# Patient Record
Sex: Female | Born: 1935 | Race: White | Hispanic: No | Smoking: Former smoker
Health system: Southern US, Community
[De-identification: ages and names within clinical notes are randomized; demographics above are authoritative.]

## PROBLEM LIST (undated history)

## (undated) DIAGNOSIS — R413 Other amnesia: Secondary | ICD-10-CM

## (undated) DIAGNOSIS — E875 Hyperkalemia: Secondary | ICD-10-CM

## (undated) DIAGNOSIS — I10 Essential (primary) hypertension: Secondary | ICD-10-CM

## (undated) DIAGNOSIS — I340 Nonrheumatic mitral (valve) insufficiency: Secondary | ICD-10-CM

## (undated) DIAGNOSIS — K5790 Diverticulosis of intestine, part unspecified, without perforation or abscess without bleeding: Secondary | ICD-10-CM

## (undated) DIAGNOSIS — R600 Localized edema: Secondary | ICD-10-CM

## (undated) DIAGNOSIS — H9190 Unspecified hearing loss, unspecified ear: Secondary | ICD-10-CM

## (undated) DIAGNOSIS — F0390 Unspecified dementia without behavioral disturbance: Secondary | ICD-10-CM

## (undated) DIAGNOSIS — E785 Hyperlipidemia, unspecified: Secondary | ICD-10-CM

## (undated) DIAGNOSIS — B351 Tinea unguium: Secondary | ICD-10-CM

## (undated) HISTORY — DX: Unspecified dementia without behavioral disturbance: F03.90

## (undated) HISTORY — DX: Hyperlipidemia, unspecified: E78.5

## (undated) HISTORY — DX: Diverticulosis of intestine, part unspecified, without perforation or abscess without bleeding: K57.90

## (undated) HISTORY — DX: Essential (primary) hypertension: I10

## (undated) HISTORY — DX: Nonrheumatic mitral (valve) insufficiency: I34.0

## (undated) HISTORY — DX: Localized edema: R60.0

## (undated) HISTORY — DX: Hyperkalemia: E87.5

## (undated) HISTORY — PX: OTHER SURGICAL HISTORY: SHX169

## (undated) HISTORY — DX: Tinea unguium: B35.1

---

## 1988-01-03 HISTORY — PX: OTHER SURGICAL HISTORY: SHX169

## 1998-09-08 ENCOUNTER — Other Ambulatory Visit: Admission: RE | Admit: 1998-09-08 | Discharge: 1998-09-08 | Payer: Self-pay | Admitting: Internal Medicine

## 1998-10-01 ENCOUNTER — Ambulatory Visit (HOSPITAL_COMMUNITY): Admission: RE | Admit: 1998-10-01 | Discharge: 1998-10-01 | Payer: Self-pay | Admitting: Vascular Surgery

## 1998-10-01 ENCOUNTER — Encounter: Payer: Self-pay | Admitting: Vascular Surgery

## 1998-11-16 ENCOUNTER — Inpatient Hospital Stay (HOSPITAL_COMMUNITY): Admission: AD | Admit: 1998-11-16 | Discharge: 1998-11-21 | Payer: Self-pay | Admitting: Internal Medicine

## 1998-11-16 ENCOUNTER — Encounter: Payer: Self-pay | Admitting: Internal Medicine

## 1998-11-17 ENCOUNTER — Encounter: Payer: Self-pay | Admitting: Internal Medicine

## 1999-12-18 ENCOUNTER — Encounter: Payer: Self-pay | Admitting: Emergency Medicine

## 1999-12-18 ENCOUNTER — Emergency Department (HOSPITAL_COMMUNITY): Admission: EM | Admit: 1999-12-18 | Discharge: 1999-12-18 | Payer: Self-pay | Admitting: Emergency Medicine

## 2000-08-10 ENCOUNTER — Encounter: Admission: RE | Admit: 2000-08-10 | Discharge: 2000-08-10 | Payer: Self-pay

## 2004-05-18 ENCOUNTER — Encounter: Admission: RE | Admit: 2004-05-18 | Discharge: 2004-05-18 | Payer: Self-pay | Admitting: Internal Medicine

## 2004-05-26 ENCOUNTER — Ambulatory Visit: Payer: Self-pay | Admitting: Gastroenterology

## 2004-07-12 ENCOUNTER — Ambulatory Visit: Payer: Self-pay | Admitting: Gastroenterology

## 2006-10-23 ENCOUNTER — Encounter: Admission: RE | Admit: 2006-10-23 | Discharge: 2006-10-23 | Payer: Self-pay | Admitting: Internal Medicine

## 2009-02-01 ENCOUNTER — Emergency Department (HOSPITAL_COMMUNITY): Admission: EM | Admit: 2009-02-01 | Discharge: 2009-02-01 | Payer: Self-pay | Admitting: Emergency Medicine

## 2009-02-03 ENCOUNTER — Observation Stay (HOSPITAL_COMMUNITY): Admission: EM | Admit: 2009-02-03 | Discharge: 2009-02-08 | Payer: Self-pay | Admitting: Emergency Medicine

## 2010-01-23 ENCOUNTER — Encounter: Payer: Self-pay | Admitting: Internal Medicine

## 2010-03-21 LAB — POCT I-STAT, CHEM 8
BUN: 7 mg/dL (ref 6–23)
Calcium, Ion: 1.03 mmol/L — ABNORMAL LOW (ref 1.12–1.32)
Chloride: 98 mEq/L (ref 96–112)
Creatinine, Ser: 0.8 mg/dL (ref 0.4–1.2)
Glucose, Bld: 98 mg/dL (ref 70–99)
HCT: 42 % (ref 36.0–46.0)
Sodium: 131 mEq/L — ABNORMAL LOW (ref 135–145)
TCO2: 28 mmol/L (ref 0–100)

## 2010-03-21 LAB — URINALYSIS, ROUTINE W REFLEX MICROSCOPIC
Nitrite: NEGATIVE
Protein, ur: NEGATIVE mg/dL
Urobilinogen, UA: 0.2 mg/dL (ref 0.0–1.0)

## 2010-03-21 LAB — DIFFERENTIAL
Basophils Absolute: 0.2 10*3/uL — ABNORMAL HIGH (ref 0.0–0.1)
Basophils Relative: 2 % — ABNORMAL HIGH (ref 0–1)
Monocytes Absolute: 0.7 10*3/uL (ref 0.1–1.0)

## 2010-03-21 LAB — CBC
HCT: 41.3 % (ref 36.0–46.0)
MCHC: 34.5 g/dL (ref 30.0–36.0)
MCV: 91.4 fL (ref 78.0–100.0)
RBC: 4.51 MIL/uL (ref 3.87–5.11)
RDW: 12.2 % (ref 11.5–15.5)
WBC: 7.8 10*3/uL (ref 4.0–10.5)

## 2010-03-24 LAB — BASIC METABOLIC PANEL
BUN: 6 mg/dL (ref 6–23)
BUN: 7 mg/dL (ref 6–23)
Calcium: 8.8 mg/dL (ref 8.4–10.5)
Creatinine, Ser: 0.63 mg/dL (ref 0.4–1.2)
GFR calc non Af Amer: >60 mL/min (ref >60)
GFR calc non Af Amer: >60 mL/min (ref >60)
Glucose, Bld: 100 mg/dL — ABNORMAL HIGH (ref 70–99)
Glucose, Bld: 96 mg/dL (ref 70–99)
Potassium: 4.1 mEq/L (ref 3.5–5.1)
Sodium: 134 mEq/L — ABNORMAL LOW (ref 135–145)

## 2010-03-24 LAB — COMPREHENSIVE METABOLIC PANEL
ALT: 18 U/L (ref 0–35)
AST: 26 U/L (ref 0–37)
Albumin: 3.8 g/dL (ref 3.5–5.2)
Alkaline Phosphatase: 68 U/L (ref 39–117)
BUN: 8 mg/dL (ref 6–23)
CO2: 29 mEq/L (ref 19–32)
Calcium: 8.3 mg/dL — ABNORMAL LOW (ref 8.4–10.5)
Calcium: 9.2 mg/dL (ref 8.4–10.5)
Creatinine, Ser: 0.59 mg/dL (ref 0.4–1.2)
Creatinine, Ser: 0.82 mg/dL (ref 0.4–1.2)
GFR calc Af Amer: >60 mL/min (ref >60)
GFR calc non Af Amer: >60 mL/min (ref >60)
Glucose, Bld: 123 mg/dL — ABNORMAL HIGH (ref 70–99)
Sodium: 125 mEq/L — ABNORMAL LOW (ref 135–145)
Total Bilirubin: 1.1 mg/dL (ref 0.3–1.2)
Total Protein: 6.7 g/dL (ref 6.0–8.3)

## 2010-03-24 LAB — DIFFERENTIAL
Basophils Absolute: 0 10*3/uL (ref 0.0–0.1)
Basophils Relative: 0 % (ref 0–1)
Basophils Relative: 0 % (ref 0–1)
Eosinophils Absolute: 0 10*3/uL (ref 0.0–0.7)
Eosinophils Absolute: 0 10*3/uL (ref 0.0–0.7)
Eosinophils Absolute: 0.2 10*3/uL (ref 0.0–0.7)
Eosinophils Relative: 0 % (ref 0–5)
Eosinophils Relative: 2 % (ref 0–5)
Lymphocytes Relative: 12 % (ref 12–46)
Lymphocytes Relative: 15 % (ref 12–46)
Lymphocytes Relative: 35 % (ref 12–46)
Lymphs Abs: 2.1 10*3/uL (ref 0.7–4.0)
Lymphs Abs: 2.2 10*3/uL (ref 0.7–4.0)
Monocytes Absolute: 0.9 10*3/uL (ref 0.1–1.0)
Monocytes Absolute: 1.1 10*3/uL — ABNORMAL HIGH (ref 0.1–1.0)
Neutro Abs: 11.9 10*3/uL — ABNORMAL HIGH (ref 1.7–7.7)
Neutro Abs: 14.9 10*3/uL — ABNORMAL HIGH (ref 1.7–7.7)
Neutrophils Relative %: 78 % — ABNORMAL HIGH (ref 43–77)

## 2010-03-24 LAB — URINALYSIS, ROUTINE W REFLEX MICROSCOPIC
Bilirubin Urine: NEGATIVE
Nitrite: NEGATIVE
Protein, ur: NEGATIVE mg/dL
Urobilinogen, UA: 0.2 mg/dL (ref 0.0–1.0)

## 2010-03-24 LAB — HEMOGLOBIN A1C
Hgb A1c MFr Bld: 6.1 % (ref 4.6–6.1)
Mean Plasma Glucose: 128 mg/dL

## 2010-03-24 LAB — CBC
HCT: 30.9 % — ABNORMAL LOW (ref 36.0–46.0)
HCT: 36.1 % (ref 36.0–46.0)
Hemoglobin: 11.9 g/dL — ABNORMAL LOW (ref 12.0–15.0)
Hemoglobin: 12.3 g/dL (ref 12.0–15.0)
MCHC: 35.1 g/dL (ref 30.0–36.0)
MCV: 91.5 fL (ref 78.0–100.0)
Platelets: 139 10*3/uL — ABNORMAL LOW (ref 150–400)
Platelets: 165 10*3/uL (ref 150–400)
RDW: 12.7 % (ref 11.5–15.5)
RDW: 12.8 % (ref 11.5–15.5)
RDW: 12.9 % (ref 11.5–15.5)
RDW: 12.9 % (ref 11.5–15.5)
WBC: 15.2 10*3/uL — ABNORMAL HIGH (ref 4.0–10.5)
WBC: 18 10*3/uL — ABNORMAL HIGH (ref 4.0–10.5)

## 2010-03-24 LAB — MAGNESIUM: Magnesium: 2.2 mg/dL (ref 1.5–2.5)

## 2010-03-24 LAB — URINE CULTURE: Colony Count: 25000

## 2010-03-24 LAB — TSH: TSH: 1.547 u[IU]/mL (ref 0.350–4.500)

## 2011-02-14 ENCOUNTER — Other Ambulatory Visit: Payer: Self-pay | Admitting: Otolaryngology

## 2011-02-16 ENCOUNTER — Ambulatory Visit
Admission: RE | Admit: 2011-02-16 | Discharge: 2011-02-16 | Disposition: A | Discharge status: Home / Self Care | Payer: No Typology Code available for payment source | Source: Ambulatory Visit | Attending: Otolaryngology | Admitting: Otolaryngology

## 2011-02-16 MED ORDER — IOHEXOL 300 MG/ML  SOLN
75.0000 mL | Freq: Once | INTRAMUSCULAR | Status: AC | PRN
  Administered 2011-02-16: 75 mL via INTRAVENOUS

## 2012-03-19 ENCOUNTER — Ambulatory Visit: Payer: Self-pay | Admitting: Podiatry

## 2012-03-19 ENCOUNTER — Encounter: Payer: Self-pay | Admitting: Podiatry

## 2012-03-19 VITALS — BP 150/68 | HR 63 | Ht 60.0 in | Wt 137.0 lb

## 2012-03-19 DIAGNOSIS — L609 Nail disorder, unspecified: Secondary | ICD-10-CM

## 2012-03-19 DIAGNOSIS — B351 Tinea unguium: Secondary | ICD-10-CM

## 2012-03-19 NOTE — Progress Notes (Signed)
Subjective: 79 y.o. year old female patient presents complaining of painful nails. Patient requests toe nails, corns and calluses trimmed.   Objective: Dermatologic: Thick yellow deformed nails on all digits bilateral.  negative of ingrown nails. Vascular: Pedal pulses are all palpable. Orthopedic: Contracted lesser digits  Neurologic: All epicritic and tactile sensations grossly intact.  Assessment: Dystrophic mycotic nails x 10.  Treatment: All mycotic nails, corns, calluses debrided.  Return in 3 months or as needed.

## 2012-03-19 NOTE — Patient Instructions (Signed)
Continue current level of care. Use gel pad on 2nd digit left to prevent from hurting. Return as needed.

## 2012-03-20 ENCOUNTER — Encounter: Payer: Self-pay | Admitting: Podiatry

## 2012-04-16 ENCOUNTER — Ambulatory Visit (INDEPENDENT_AMBULATORY_CARE_PROVIDER_SITE_OTHER): Payer: PRIVATE HEALTH INSURANCE | Admitting: Podiatry

## 2012-04-16 ENCOUNTER — Ambulatory Visit: Payer: PRIVATE HEALTH INSURANCE | Admitting: Podiatry

## 2012-04-16 VITALS — BP 138/75 | HR 57 | Ht 60.0 in | Wt 137.0 lb

## 2012-04-16 DIAGNOSIS — L84 Corns and callosities: Secondary | ICD-10-CM | POA: Insufficient documentation

## 2012-04-16 DIAGNOSIS — B351 Tinea unguium: Secondary | ICD-10-CM

## 2012-04-16 NOTE — Progress Notes (Signed)
S: Seen for painful corns. Has thick digital corn on 4th right that is painful. All lesser digits are contracted but without corns. All nails are mildly elongated. No other new problems.  A: Thick build up corn at distal plantar aspect of 4th digit right. Severely contracted, hammered toe 4th right.  Other contracted digits bilateral without corns. P: All hypertrophic nails debrided. Trimmed affected corn on 4th right and buttress pad placed with instruction.

## 2012-07-09 ENCOUNTER — Ambulatory Visit (INDEPENDENT_AMBULATORY_CARE_PROVIDER_SITE_OTHER): Payer: PRIVATE HEALTH INSURANCE | Admitting: Podiatry

## 2012-07-09 DIAGNOSIS — L84 Corns and callosities: Secondary | ICD-10-CM

## 2012-07-09 DIAGNOSIS — L609 Nail disorder, unspecified: Secondary | ICD-10-CM

## 2012-07-12 NOTE — Progress Notes (Signed)
Subjective:  79 year old female presents for painful toe nails. Also complained of swelling on both lower limbs. She is wearing compression stockings.   Objective: Has thick digital corn on 4th right that is painful.  All lesser digits are contracted but without corns.  All nails are mildly elongated.  Both lower limbs are swollen more than usual.   Assessment:  Hypertrophic mycotic nails x 10. Severely contracted, hammered toe 4th right.  Other contracted digits bilateral without corns.   Plan:  All hypertrophic nails debrided.  Advised to take seam off from her compression stocking that is cutting circulation at below knee level to prevent swelling in lower limbs.

## 2012-10-01 ENCOUNTER — Encounter: Payer: Self-pay | Admitting: Podiatry

## 2012-10-01 ENCOUNTER — Ambulatory Visit (INDEPENDENT_AMBULATORY_CARE_PROVIDER_SITE_OTHER): Payer: PRIVATE HEALTH INSURANCE | Admitting: Podiatry

## 2012-10-01 VITALS — BP 144/84 | HR 71 | Ht 60.0 in | Wt 132.0 lb

## 2012-10-01 DIAGNOSIS — L609 Nail disorder, unspecified: Secondary | ICD-10-CM

## 2012-10-01 DIAGNOSIS — L84 Corns and callosities: Secondary | ICD-10-CM

## 2012-10-01 NOTE — Patient Instructions (Addendum)
Seen for painful corn and long nails. All debrided. Swelling has gone down much. Continue current level of care.

## 2012-10-01 NOTE — Progress Notes (Signed)
Seen for painful corn 4th right. All nails are over due for trimming. Swelling on lower limb has improved. Both ankles have minimum edema.  Contracted lesser digits 2nd and 4th bilateral.  Assessment: Contracted digits with painful corn 4th right. Mycotic nails x 10.  Plan: Routine foot care. Debrided nails and corns.

## 2012-11-11 ENCOUNTER — Encounter: Payer: Self-pay | Admitting: Podiatry

## 2012-11-11 ENCOUNTER — Ambulatory Visit (INDEPENDENT_AMBULATORY_CARE_PROVIDER_SITE_OTHER): Payer: PRIVATE HEALTH INSURANCE | Admitting: Podiatry

## 2012-11-11 VITALS — BP 138/90 | HR 63 | Ht 60.0 in | Wt 132.0 lb

## 2012-11-11 DIAGNOSIS — L609 Nail disorder, unspecified: Secondary | ICD-10-CM

## 2012-11-11 DIAGNOSIS — L84 Corns and callosities: Secondary | ICD-10-CM

## 2012-11-11 NOTE — Patient Instructions (Signed)
Seen for painful corns and toe nails. All debrided. No acute lesions or abnormal findings. Return as needed.

## 2012-11-11 NOTE — Progress Notes (Signed)
Subjective: Patient requests toe nails and corns trimmed. Patient is wearing compression socks and uses digital gel pads. Interdigital corn on 2nd left, distal corn on 4th right.  Objective: Multiple contracted digits bilateral. Positive of ankle edema bilateral. Thick dystrophic nails x 10.  Ingrown nail on both great toe nails.  Assessment: Ingrown mycotic nails both great toe. Digital corn 2nd left, 4th right.  Plan:  Debrided all nails, corns and calluses. Patient purchased fresh new gel pads. Return as needed.

## 2013-01-17 ENCOUNTER — Ambulatory Visit (INDEPENDENT_AMBULATORY_CARE_PROVIDER_SITE_OTHER): Payer: PRIVATE HEALTH INSURANCE | Admitting: Podiatry

## 2013-01-17 ENCOUNTER — Encounter: Payer: Self-pay | Admitting: Podiatry

## 2013-01-17 VITALS — BP 163/74 | HR 68 | Ht 60.0 in | Wt 134.0 lb

## 2013-01-17 DIAGNOSIS — L609 Nail disorder, unspecified: Secondary | ICD-10-CM

## 2013-01-17 DIAGNOSIS — L84 Corns and callosities: Secondary | ICD-10-CM

## 2013-01-17 NOTE — Patient Instructions (Signed)
Seen for hypertrophic nails. All nails debrided. Return in 3 months or as needed.  

## 2013-01-17 NOTE — Progress Notes (Signed)
Seen for painful corn and nails. All debrided.  Buttress pad placed under 3rd digit right. Tube foam pad placed in anterior ankle to reduce wrinkle from compression stocking. Return as needed.

## 2013-03-18 ENCOUNTER — Encounter: Payer: Self-pay | Admitting: Podiatry

## 2013-03-18 ENCOUNTER — Ambulatory Visit (INDEPENDENT_AMBULATORY_CARE_PROVIDER_SITE_OTHER): Payer: PRIVATE HEALTH INSURANCE | Admitting: Podiatry

## 2013-03-18 VITALS — BP 164/67 | HR 64 | Ht 60.0 in | Wt 140.0 lb

## 2013-03-18 DIAGNOSIS — M79606 Pain in leg, unspecified: Secondary | ICD-10-CM | POA: Insufficient documentation

## 2013-03-18 DIAGNOSIS — M79609 Pain in unspecified limb: Secondary | ICD-10-CM

## 2013-03-18 DIAGNOSIS — L609 Nail disorder, unspecified: Secondary | ICD-10-CM

## 2013-03-18 DIAGNOSIS — M2040 Other hammer toe(s) (acquired), unspecified foot: Secondary | ICD-10-CM

## 2013-03-18 DIAGNOSIS — B351 Tinea unguium: Secondary | ICD-10-CM | POA: Insufficient documentation

## 2013-03-18 NOTE — Progress Notes (Signed)
Subjective: 80 year old present aided by a cane complaining of pain in 3rd digit right.   Objective: Thick hard distal clivus 3rd right. Hypertrophic nails x 10. Generalized edema bilateral lower limbs. All debrided.  Pedal pulses not palpable due to edema. Contracted lesser digits 2-5 bilateral.  Assessment: Contracted digits with painful corn 4th right. Mycotic nails x 10.  PVD.  Plan: All nails debrided. Digital corns debrided.  Buttress pad placed under 3rd digit right.  Return in 3 month or as needed.

## 2013-03-18 NOTE — Patient Instructions (Signed)
Seen for hypertrophic nails and painful corn. All nails and corns debrided. Buttress pad for right 4th digit dispensed to use during the day. Return in 3 months or as needed.

## 2013-05-02 ENCOUNTER — Ambulatory Visit (INDEPENDENT_AMBULATORY_CARE_PROVIDER_SITE_OTHER): Payer: PRIVATE HEALTH INSURANCE | Admitting: Podiatry

## 2013-05-02 ENCOUNTER — Encounter: Payer: Self-pay | Admitting: Podiatry

## 2013-05-02 VITALS — BP 152/62 | HR 64

## 2013-05-02 DIAGNOSIS — M79609 Pain in unspecified limb: Secondary | ICD-10-CM

## 2013-05-02 DIAGNOSIS — B351 Tinea unguium: Secondary | ICD-10-CM

## 2013-05-02 DIAGNOSIS — M79606 Pain in leg, unspecified: Secondary | ICD-10-CM

## 2013-05-02 DIAGNOSIS — L84 Corns and callosities: Secondary | ICD-10-CM

## 2013-05-02 NOTE — Progress Notes (Signed)
Subjective:  80 year old present aided by a cane complaining of painful feet.    Objective:  Thick hard distal clivus 3rd right.  Hypertrophic nails x 10.  Pedal pulses not palpable due to edema.  Contracted lesser digits 2-5 bilateral.   Assessment: Contracted digits with painful corn 4th right.  Mycotic nails x 10.  PVD.   Plan:  All nails debrided. Digital corns debrided.  Buttress pad placed under 3rd digit right.  Return in 3 month or as needed.

## 2013-05-02 NOTE — Patient Instructions (Signed)
Seen for hypertrophic nails. All nails and corns debrided. Buttress pad dispensed.  Return in 3 months or as needed.

## 2013-06-03 ENCOUNTER — Ambulatory Visit: Payer: PRIVATE HEALTH INSURANCE | Admitting: Podiatry

## 2013-06-06 ENCOUNTER — Encounter: Payer: Self-pay | Admitting: Podiatry

## 2013-06-06 ENCOUNTER — Ambulatory Visit (INDEPENDENT_AMBULATORY_CARE_PROVIDER_SITE_OTHER): Payer: PRIVATE HEALTH INSURANCE | Admitting: Podiatry

## 2013-06-06 VITALS — BP 156/71 | HR 67

## 2013-06-06 DIAGNOSIS — L84 Corns and callosities: Secondary | ICD-10-CM

## 2013-06-06 DIAGNOSIS — B351 Tinea unguium: Secondary | ICD-10-CM

## 2013-06-06 NOTE — Patient Instructions (Signed)
Seen for hypertrophic nails and corns. All nails and corns debrided. Right 3rd and 4th toe placed under Buttress pad. Return as needed.

## 2013-06-06 NOTE — Progress Notes (Signed)
Subjective:  80 year old present aided by a cane complaining of painful right foot with corn.  Objective:  Thick hard distal clivus 4th right.  Hypertrophic nails x 10.  Pedal pulses not palpable due to edema.  Contracted lesser digits 2-5 bilateral.   Assessment: Contracted digits with painful corn 4th right.  Mycotic nails x 10.  PVD.   Plan:  All nails debrided. Digital corns debrided.  Buttress pad placed under 4rd and 4th digit right.  Return in 3 month or as needed.

## 2013-07-01 ENCOUNTER — Ambulatory Visit (INDEPENDENT_AMBULATORY_CARE_PROVIDER_SITE_OTHER): Payer: PRIVATE HEALTH INSURANCE | Admitting: Podiatry

## 2013-07-01 ENCOUNTER — Encounter: Payer: Self-pay | Admitting: Podiatry

## 2013-07-01 DIAGNOSIS — L84 Corns and callosities: Secondary | ICD-10-CM

## 2013-07-01 DIAGNOSIS — B351 Tinea unguium: Secondary | ICD-10-CM

## 2013-07-01 DIAGNOSIS — M79604 Pain in right leg: Secondary | ICD-10-CM

## 2013-07-01 DIAGNOSIS — M79609 Pain in unspecified limb: Secondary | ICD-10-CM

## 2013-07-01 NOTE — Patient Instructions (Signed)
Seen for painful corn 3rd right.  All nails and corns debrided and padded. Return as needed.

## 2013-07-01 NOTE — Progress Notes (Signed)
Subjective:  80 year old present aided by a cane complaining of pain on right 3rd toe.  Objective:  Thick hard distal clivus 3rd right, pre ulcerative.  Hypertrophic nails x 10.  Pedal pulses not palpable due to edema.  Positive of mild pedal edema.  Contracted lesser digits 2-5 bilateral.   Assessment: Contracted digits with painful corn 4th right.  Mycotic nails x 10.  PVD.   Plan:  All nails debrided. Digital corns debrided.  Buttress pad placed under 3rd digit right.  Return in 3 month or as needed.

## 2013-09-01 ENCOUNTER — Ambulatory Visit (INDEPENDENT_AMBULATORY_CARE_PROVIDER_SITE_OTHER): Payer: PRIVATE HEALTH INSURANCE | Admitting: Podiatry

## 2013-09-01 ENCOUNTER — Encounter: Payer: Self-pay | Admitting: Podiatry

## 2013-09-01 VITALS — Ht 60.0 in | Wt 136.0 lb

## 2013-09-01 DIAGNOSIS — M79605 Pain in left leg: Secondary | ICD-10-CM

## 2013-09-01 DIAGNOSIS — M79606 Pain in leg, unspecified: Secondary | ICD-10-CM

## 2013-09-01 DIAGNOSIS — M79609 Pain in unspecified limb: Secondary | ICD-10-CM

## 2013-09-01 DIAGNOSIS — B351 Tinea unguium: Secondary | ICD-10-CM

## 2013-09-01 NOTE — Patient Instructions (Signed)
Seen for hypertrophic nails and painful ingrown left big toe nail. All nails debrided. Return in 3 months or as needed.

## 2013-09-01 NOTE — Progress Notes (Signed)
Subjective:  80 year old female presents complaining of ingrown left great toe nail.   Objective:  Inflamed ingrown left hallucal nail medial border.  Thick hard distal clivus 3rd right, pre ulcerative.  Hypertrophic nails x 10.  Pedal pulses not palpable due to edema.  Positive of mild pedal edema.  Contracted lesser digits 2-5 bilateral.   Assessment: Painful ingrown nail left hallux without infection. Contracted digits with painful corn 4th right.  Mycotic nails x 10.  PVD.   Plan:  All nails debrided. Digital corns debrided.  Buttress pad for 3rd digit right dispensed x 2.  Return in 3 month or as needed.

## 2013-10-24 ENCOUNTER — Ambulatory Visit (INDEPENDENT_AMBULATORY_CARE_PROVIDER_SITE_OTHER): Payer: PRIVATE HEALTH INSURANCE | Admitting: Podiatry

## 2013-10-24 ENCOUNTER — Encounter: Payer: Self-pay | Admitting: Podiatry

## 2013-10-24 VITALS — BP 154/64 | HR 84

## 2013-10-24 DIAGNOSIS — M79606 Pain in leg, unspecified: Secondary | ICD-10-CM

## 2013-10-24 DIAGNOSIS — B351 Tinea unguium: Secondary | ICD-10-CM

## 2013-10-24 NOTE — Patient Instructions (Signed)
Seen for hypertrophic nails. All nails debrided. Return in 3 months or as needed.  

## 2013-10-24 NOTE — Progress Notes (Signed)
Subjective:  80 year old female presents complaining of pain in left great toe nail.   Objective:  Ingrown left hallucal nail medial border.  Hypertrophic nails x 10.  Pedal pulses not palpable due to edema.  Positive of mild pedal edema.  Contracted lesser digits 2-5 bilateral.   Assessment:  Painful ingrown nail left hallux without infection. Mycotic nails x 10.  PVD.   Plan:  All nails debrided.  Return in 3 month or as needed.

## 2013-12-19 ENCOUNTER — Ambulatory Visit (INDEPENDENT_AMBULATORY_CARE_PROVIDER_SITE_OTHER): Payer: PRIVATE HEALTH INSURANCE | Admitting: Podiatry

## 2013-12-19 ENCOUNTER — Encounter: Payer: Self-pay | Admitting: Podiatry

## 2013-12-19 VITALS — BP 140/90 | HR 62

## 2013-12-19 DIAGNOSIS — M2041 Other hammer toe(s) (acquired), right foot: Secondary | ICD-10-CM

## 2013-12-19 DIAGNOSIS — B351 Tinea unguium: Secondary | ICD-10-CM

## 2013-12-19 DIAGNOSIS — M79604 Pain in right leg: Secondary | ICD-10-CM

## 2013-12-19 DIAGNOSIS — L84 Corns and callosities: Secondary | ICD-10-CM

## 2013-12-19 DIAGNOSIS — M79606 Pain in leg, unspecified: Secondary | ICD-10-CM

## 2013-12-19 NOTE — Progress Notes (Signed)
Subjective:  80 year old female presents complaining of painful corns.  Objective:  Hypertrophic nails x 10.  Pedal pulses not palpable due to edema.  Positive of mild pedal edema.  Contracted lesser digits 2-5 bilateral.  Painful corn 4rh digit right.  Assessment:  Painful corns 4th digit right, pre ulcerative.  Mycotic nails x 10.  PVD.   Plan:  All nails debrided. Corns debrided and padded. Return in 3 month or as needed.

## 2014-01-09 ENCOUNTER — Ambulatory Visit: Payer: PRIVATE HEALTH INSURANCE | Admitting: Podiatry

## 2014-01-13 ENCOUNTER — Ambulatory Visit (INDEPENDENT_AMBULATORY_CARE_PROVIDER_SITE_OTHER): Payer: Medicare Other | Admitting: Podiatry

## 2014-01-13 ENCOUNTER — Encounter: Payer: Self-pay | Admitting: Podiatry

## 2014-01-13 VITALS — BP 139/61 | HR 63

## 2014-01-13 DIAGNOSIS — M2041 Other hammer toe(s) (acquired), right foot: Secondary | ICD-10-CM

## 2014-01-13 DIAGNOSIS — M79606 Pain in leg, unspecified: Secondary | ICD-10-CM

## 2014-01-13 DIAGNOSIS — B351 Tinea unguium: Secondary | ICD-10-CM

## 2014-01-13 NOTE — Progress Notes (Signed)
Subjective:  81 year old female presents complaining of painful corns.  Objective:  Hypertrophic nails x 10.  Pedal pulses not palpable due to edema.  Positive of mild pedal edema.  Contracted lesser digits 2-5 bilateral.  Painful corn 4rh digit right.  Assessment:  Painful corns 4th digit right, pre ulcerative.  Mycotic nails x 10.  PVD.   Plan:  All nails debrided. Corns debrided and padded. Return as needed.

## 2014-01-13 NOTE — Patient Instructions (Signed)
Seen for hypertrophic nails and painful corn. All nails debrided. Padded painful corn. Keep the pad in place while ambulating. Return as needed.

## 2014-02-05 DIAGNOSIS — E039 Hypothyroidism, unspecified: Secondary | ICD-10-CM | POA: Diagnosis not present

## 2014-02-05 DIAGNOSIS — I10 Essential (primary) hypertension: Secondary | ICD-10-CM | POA: Diagnosis not present

## 2014-02-12 DIAGNOSIS — I10 Essential (primary) hypertension: Secondary | ICD-10-CM | POA: Diagnosis not present

## 2014-02-12 DIAGNOSIS — E039 Hypothyroidism, unspecified: Secondary | ICD-10-CM | POA: Diagnosis not present

## 2014-02-12 DIAGNOSIS — E782 Mixed hyperlipidemia: Secondary | ICD-10-CM | POA: Diagnosis not present

## 2014-02-12 DIAGNOSIS — E875 Hyperkalemia: Secondary | ICD-10-CM | POA: Diagnosis not present

## 2014-02-27 ENCOUNTER — Ambulatory Visit (INDEPENDENT_AMBULATORY_CARE_PROVIDER_SITE_OTHER): Payer: Medicare Other | Admitting: Podiatry

## 2014-02-27 ENCOUNTER — Encounter: Payer: Self-pay | Admitting: Podiatry

## 2014-02-27 VITALS — BP 148/100 | HR 62

## 2014-02-27 DIAGNOSIS — L600 Ingrowing nail: Secondary | ICD-10-CM

## 2014-02-27 DIAGNOSIS — L84 Corns and callosities: Secondary | ICD-10-CM

## 2014-02-27 DIAGNOSIS — M79606 Pain in leg, unspecified: Secondary | ICD-10-CM

## 2014-02-27 DIAGNOSIS — B351 Tinea unguium: Secondary | ICD-10-CM

## 2014-02-27 NOTE — Progress Notes (Signed)
Subjective:  81 year old female presents complaining of pain in left great toe.  Objective:  Hypertrophic nails x 10. Ingrown nail left great toe medial border. Digital corns at distal end of 3rd and 4th digits bilateral. Pedal pulses not palpable due to edema.  Positive of mild pedal edema.  Contracted lesser digits 2-5 bilateral.    Assessment:  Corns 3rd and4th digit bilateral, pre ulcerative.  Mycotic nails x 10. Ingrown nail left great toe medial border without infection or drainage. Skin is not broken. PVD.   Plan:  All nails debrided. Corns debrided and padded. Both feet placed under 3rd and 4th digit with removable buttress pad with instruction to take them off at night and place back under toe toes in the morning.  Return as needed.  Return as needed.

## 2014-02-27 NOTE — Patient Instructions (Signed)
Seen for painful ingrown nail left great toe. All nails debrided. Padded 3rd and 4th digits both feet. Return as needed.

## 2014-04-08 ENCOUNTER — Ambulatory Visit (INDEPENDENT_AMBULATORY_CARE_PROVIDER_SITE_OTHER): Payer: Medicare Other | Admitting: Podiatry

## 2014-04-08 ENCOUNTER — Encounter: Payer: Self-pay | Admitting: Podiatry

## 2014-04-08 ENCOUNTER — Ambulatory Visit: Payer: Medicare Other | Admitting: Podiatry

## 2014-04-08 VITALS — BP 164/64 | HR 60

## 2014-04-08 DIAGNOSIS — L84 Corns and callosities: Secondary | ICD-10-CM

## 2014-04-08 DIAGNOSIS — M2040 Other hammer toe(s) (acquired), unspecified foot: Secondary | ICD-10-CM

## 2014-04-08 NOTE — Patient Instructions (Signed)
Seen for hypertrophic nails and painful corns. All nails and corsn debrided and padded. Return as needed.

## 2014-04-08 NOTE — Progress Notes (Signed)
Subjective:  81 year old female presents complaining of pain at the tip of toes on 3rd both feet.   Objective:  Hypertrophic nails x 10. Ingrown nail left great toe medial border. Digital corns at distal end of 3rd and 4th digits bilateral. Pedal pulses not palpable due to edema.  Positive of mild pedal edema.  Contracted lesser digits 2-5 bilateral.    Assessment:  Corns 3rd and4th digit bilateral, pre ulcerative.  Mycotic nails x 10.  PVD.   Plan:  All nails debrided. Corns debrided and padded. Both feet placed under 3rd and 4th digit with removable buttress pad with instruction to take them off at night and place back under toe toes in the morning.  Return as needed.  Return as needed.

## 2014-06-03 ENCOUNTER — Encounter: Payer: Self-pay | Admitting: Podiatry

## 2014-06-03 ENCOUNTER — Ambulatory Visit (INDEPENDENT_AMBULATORY_CARE_PROVIDER_SITE_OTHER): Payer: Medicare Other | Admitting: Podiatry

## 2014-06-03 VITALS — BP 145/66 | HR 73

## 2014-06-03 DIAGNOSIS — M79606 Pain in leg, unspecified: Secondary | ICD-10-CM

## 2014-06-03 DIAGNOSIS — B351 Tinea unguium: Secondary | ICD-10-CM

## 2014-06-03 NOTE — Patient Instructions (Signed)
Seen for hypertrophic nails and calluses. All nails debrided and padded. Return in 3 months or as needed.

## 2014-06-03 NOTE — Progress Notes (Signed)
Subjective:  81 year old female presents complaining of pain in left great toe nail and at the tip of toes on 3rd both feet.   Objective:  Hypertrophic nails x 10. Ingrown nail left great toe medial border, painful. Digital corns at distal end of 3rd and 4th digits bilateral. Pedal pulses not palpable due to edema.  Positive of mild pedal edema.  Contracted lesser digits 2-5 bilateral.   Assessment:  Corns 3rd and4th digit bilateral, pre ulcerative.  Ingrown nail left great toe.  Mycotic nails x 10.  PVD.   Plan:  All nails debrided. Corns debrided and padded. Both feet placed under 3rd and 4th digit with removable buttress pad with instruction to take them off at night and place back under toe toes in the morning.  Return as needed.

## 2014-07-27 ENCOUNTER — Ambulatory Visit: Payer: Self-pay | Admitting: Podiatry

## 2014-08-05 ENCOUNTER — Ambulatory Visit (INDEPENDENT_AMBULATORY_CARE_PROVIDER_SITE_OTHER): Payer: Medicare Other | Admitting: Podiatry

## 2014-08-05 ENCOUNTER — Encounter: Payer: Self-pay | Admitting: Podiatry

## 2014-08-05 VITALS — BP 147/87 | HR 62

## 2014-08-05 DIAGNOSIS — B351 Tinea unguium: Secondary | ICD-10-CM

## 2014-08-05 DIAGNOSIS — M79606 Pain in leg, unspecified: Secondary | ICD-10-CM

## 2014-08-05 DIAGNOSIS — L84 Corns and callosities: Secondary | ICD-10-CM

## 2014-08-05 NOTE — Patient Instructions (Signed)
Seen for hypertrophic nails. All nails debrided. Return as needed.  

## 2014-08-05 NOTE — Progress Notes (Signed)
Subjective:  81 year old female presents complaining of pain in left great toe nail and at the tip of toes on 3rd both feet.  No new problems.   Objective:  Hypertrophic nails x 10. Ingrown nail left great toe medial border, painful. Digital corns at distal end of 3rd and 4th digits bilateral. Pedal pulses not palpable due to edema.  Positive of mild pedal edema.  Contracted lesser digits 2-5 bilateral.   Assessment:  Corns 3rd and4th digit bilateral, pre ulcerative.  Ingrown nail left great toe.  Mycotic nails x 10.  PVD.   Plan:  All nails debrided. Corns debrided and padded. Both feet placed under 3rd and 4th digit with removable buttress pad with instruction to take them off at night and place back under toe toes in the morning.  Return as needed.

## 2014-08-06 DIAGNOSIS — I10 Essential (primary) hypertension: Secondary | ICD-10-CM | POA: Diagnosis not present

## 2014-08-06 DIAGNOSIS — E875 Hyperkalemia: Secondary | ICD-10-CM | POA: Diagnosis not present

## 2014-08-06 DIAGNOSIS — E039 Hypothyroidism, unspecified: Secondary | ICD-10-CM | POA: Diagnosis not present

## 2014-08-06 DIAGNOSIS — E782 Mixed hyperlipidemia: Secondary | ICD-10-CM | POA: Diagnosis not present

## 2014-08-13 DIAGNOSIS — E782 Mixed hyperlipidemia: Secondary | ICD-10-CM | POA: Diagnosis not present

## 2014-08-13 DIAGNOSIS — I10 Essential (primary) hypertension: Secondary | ICD-10-CM | POA: Diagnosis not present

## 2014-08-13 DIAGNOSIS — F0390 Unspecified dementia without behavioral disturbance: Secondary | ICD-10-CM | POA: Diagnosis not present

## 2014-08-13 DIAGNOSIS — E039 Hypothyroidism, unspecified: Secondary | ICD-10-CM | POA: Diagnosis not present

## 2014-08-26 ENCOUNTER — Emergency Department (HOSPITAL_COMMUNITY)
Admission: EM | Admit: 2014-08-26 | Discharge: 2014-08-26 | Disposition: A | Discharge status: Home / Self Care | Payer: Medicare Other | Attending: Emergency Medicine | Admitting: Emergency Medicine

## 2014-08-26 ENCOUNTER — Encounter (HOSPITAL_COMMUNITY): Payer: Self-pay

## 2014-08-26 DIAGNOSIS — Z880 Allergy status to penicillin: Secondary | ICD-10-CM | POA: Insufficient documentation

## 2014-08-26 DIAGNOSIS — E785 Hyperlipidemia, unspecified: Secondary | ICD-10-CM | POA: Insufficient documentation

## 2014-08-26 DIAGNOSIS — Z7982 Long term (current) use of aspirin: Secondary | ICD-10-CM | POA: Insufficient documentation

## 2014-08-26 DIAGNOSIS — Z79899 Other long term (current) drug therapy: Secondary | ICD-10-CM | POA: Insufficient documentation

## 2014-08-26 DIAGNOSIS — H9190 Unspecified hearing loss, unspecified ear: Secondary | ICD-10-CM | POA: Insufficient documentation

## 2014-08-26 DIAGNOSIS — Z87891 Personal history of nicotine dependence: Secondary | ICD-10-CM | POA: Diagnosis not present

## 2014-08-26 DIAGNOSIS — R5383 Other fatigue: Secondary | ICD-10-CM | POA: Diagnosis not present

## 2014-08-26 DIAGNOSIS — F515 Nightmare disorder: Secondary | ICD-10-CM

## 2014-08-26 DIAGNOSIS — Z872 Personal history of diseases of the skin and subcutaneous tissue: Secondary | ICD-10-CM | POA: Diagnosis not present

## 2014-08-26 DIAGNOSIS — I10 Essential (primary) hypertension: Secondary | ICD-10-CM | POA: Diagnosis not present

## 2014-08-26 DIAGNOSIS — R5381 Other malaise: Secondary | ICD-10-CM

## 2014-08-26 DIAGNOSIS — F4489 Other dissociative and conversion disorders: Secondary | ICD-10-CM | POA: Diagnosis not present

## 2014-08-26 DIAGNOSIS — Z8659 Personal history of other mental and behavioral disorders: Secondary | ICD-10-CM | POA: Insufficient documentation

## 2014-08-26 DIAGNOSIS — R69 Illness, unspecified: Secondary | ICD-10-CM | POA: Diagnosis not present

## 2014-08-26 DIAGNOSIS — R531 Weakness: Secondary | ICD-10-CM | POA: Diagnosis present

## 2014-08-26 HISTORY — DX: Unspecified hearing loss, unspecified ear: H91.90

## 2014-08-26 HISTORY — DX: Other amnesia: R41.3

## 2014-08-26 LAB — URINALYSIS, ROUTINE W REFLEX MICROSCOPIC
BILIRUBIN URINE: NEGATIVE
Glucose, UA: NEGATIVE mg/dL
Hgb urine dipstick: NEGATIVE
Ketones, ur: NEGATIVE mg/dL
Leukocytes, UA: NEGATIVE
NITRITE: NEGATIVE
PROTEIN: NEGATIVE mg/dL
SPECIFIC GRAVITY, URINE: 1.006 (ref 1.005–1.030)
UROBILINOGEN UA: 0.2 mg/dL (ref 0.0–1.0)
pH: 7.5 (ref 5.0–8.0)

## 2014-08-26 LAB — CBC WITH DIFFERENTIAL/PLATELET
BASOS ABS: 0 10*3/uL (ref 0.0–0.1)
BASOS PCT: 0 % (ref 0–1)
EOS ABS: 0.1 10*3/uL (ref 0.0–0.7)
Eosinophils Relative: 2 % (ref 0–5)
HEMATOCRIT: 38.1 % (ref 36.0–46.0)
HEMOGLOBIN: 12.5 g/dL (ref 12.0–15.0)
Lymphocytes Relative: 33 % (ref 12–46)
Lymphs Abs: 2.3 10*3/uL (ref 0.7–4.0)
MCH: 29.1 pg (ref 26.0–34.0)
MCHC: 32.8 g/dL (ref 30.0–36.0)
MCV: 88.8 fL (ref 78.0–100.0)
MONO ABS: 0.5 10*3/uL (ref 0.1–1.0)
Monocytes Relative: 8 % (ref 3–12)
NEUTROS ABS: 3.9 10*3/uL (ref 1.7–7.7)
NEUTROS PCT: 57 % (ref 43–77)
Platelets: 191 10*3/uL (ref 150–400)
RBC: 4.29 MIL/uL (ref 3.87–5.11)
RDW: 13.3 % (ref 11.5–15.5)
WBC: 6.8 10*3/uL (ref 4.0–10.5)

## 2014-08-26 LAB — BASIC METABOLIC PANEL
ANION GAP: 7 (ref 5–15)
BUN: 14 mg/dL (ref 6–20)
CALCIUM: 9.4 mg/dL (ref 8.9–10.3)
CO2: 28 mmol/L (ref 22–32)
Chloride: 100 mmol/L — ABNORMAL LOW (ref 101–111)
Creatinine, Ser: 0.75 mg/dL (ref 0.44–1.00)
Glucose, Bld: 105 mg/dL — ABNORMAL HIGH (ref 65–99)
Potassium: 3.9 mmol/L (ref 3.5–5.1)
SODIUM: 135 mmol/L (ref 135–145)

## 2014-08-26 LAB — I-STAT CHEM 8, ED
BUN: 14 mg/dL (ref 6–20)
Calcium, Ion: 1.23 mmol/L (ref 1.13–1.30)
Chloride: 99 mmol/L — ABNORMAL LOW (ref 101–111)
Creatinine, Ser: 0.8 mg/dL (ref 0.44–1.00)
Glucose, Bld: 100 mg/dL — ABNORMAL HIGH (ref 65–99)
HEMATOCRIT: 41 % (ref 36.0–46.0)
HEMOGLOBIN: 13.9 g/dL (ref 12.0–15.0)
Potassium: 3.9 mmol/L (ref 3.5–5.1)
SODIUM: 137 mmol/L (ref 135–145)
TCO2: 25 mmol/L (ref 0–100)

## 2014-08-26 NOTE — ED Notes (Signed)
Bed: MY11 Expected date:  Expected time:  Means of arrival:  Comments: EMS- 81yo F, weakness

## 2014-08-26 NOTE — Discharge Instructions (Signed)
Fatigue Since her symptoms seem to have developed after starting Aricept, at this time he may discontinue the Aricept and discuss it with your family physician.     Fatigue is a feeling of tiredness, lack of energy, lack of motivation, or feeling tired all the time. Having enough rest, good nutrition, and reducing stress will normally reduce fatigue. Consult your caregiver if it persists. The nature of your fatigue will help your caregiver to find out its cause. The treatment is based on the cause.  CAUSES  There are many causes for fatigue. Most of the time, fatigue can be traced to one or more of your habits or routines. Most causes fit into one or more of three general areas. They are: Lifestyle problems  Sleep disturbances.  Overwork.  Physical exertion.  Unhealthy habits.  Poor eating habits or eating disorders.  Alcohol and/or drug use .  Lack of proper nutrition (malnutrition). Psychological problems  Stress and/or anxiety problems.  Depression.  Grief.  Boredom. Medical Problems or Conditions  Anemia.  Pregnancy.  Thyroid gland problems.  Recovery from major surgery.  Continuous pain.  Emphysema or asthma that is not well controlled  Allergic conditions.  Diabetes.  Infections (such as mononucleosis).  Obesity.  Sleep disorders, such as sleep apnea.  Heart failure or other heart-related problems.  Cancer.  Kidney disease.  Liver disease.  Effects of certain medicines such as antihistamines, cough and cold remedies, prescription pain medicines, heart and blood pressure medicines, drugs used for treatment of cancer, and some antidepressants. SYMPTOMS  The symptoms of fatigue include:   Lack of energy.  Lack of drive (motivation).  Drowsiness.  Feeling of indifference to the surroundings. DIAGNOSIS  The details of how you feel help guide your caregiver in finding out what is causing the fatigue. You will be asked about your present and  past health condition. It is important to review all medicines that you take, including prescription and non-prescription items. A thorough exam will be done. You will be questioned about your feelings, habits, and normal lifestyle. Your caregiver may suggest blood tests, urine tests, or other tests to look for common medical causes of fatigue.  TREATMENT  Fatigue is treated by correcting the underlying cause. For example, if you have continuous pain or depression, treating these causes will improve how you feel. Similarly, adjusting the dose of certain medicines will help in reducing fatigue.  HOME CARE INSTRUCTIONS   Try to get the required amount of good sleep every night.  Eat a healthy and nutritious diet, and drink enough water throughout the day.  Practice ways of relaxing (including yoga or meditation).  Exercise regularly.  Make plans to change situations that cause stress. Act on those plans so that stresses decrease over time. Keep your work and personal routine reasonable.  Avoid street drugs and minimize use of alcohol.  Start taking a daily multivitamin after consulting your caregiver. SEEK MEDICAL CARE IF:   You have persistent tiredness, which cannot be accounted for.  You have fever.  You have unintentional weight loss.  You have headaches.  You have disturbed sleep throughout the night.  You are feeling sad.  You have constipation.  You have dry skin.  You have gained weight.  You are taking any new or different medicines that you suspect are causing fatigue.  You are unable to sleep at night.  You develop any unusual swelling of your legs or other parts of your body. SEEK IMMEDIATE MEDICAL CARE IF:  You are feeling confused.  Your vision is blurred.  You feel faint or pass out.  You develop severe headache.  You develop severe abdominal, pelvic, or back pain.  You develop chest pain, shortness of breath, or an irregular or fast  heartbeat.  You are unable to pass a normal amount of urine.  You develop abnormal bleeding such as bleeding from the rectum or you vomit blood.  You have thoughts about harming yourself or committing suicide.  You are worried that you might harm someone else. MAKE SURE YOU:   Understand these instructions.  Will watch your condition.  Will get help right away if you are not doing well or get worse. Document Released: 10/16/2006 Document Revised: 03/13/2011 Document Reviewed: 04/22/2013 [facility] Patient Information 2015 [place], [place]. This information is not intended to replace advice given to you by your health care provider. Make sure you discuss any questions you have with your health care provider. Confusion Confusion is the inability to think with your usual speed or clarity. Confusion may come on quickly or slowly over time. How quickly the confusion comes on depends on the cause. Confusion can be due to any number of causes. CAUSES   Concussion, head injury, or head trauma.  Seizures.  Stroke.  Fever.  Brain tumor.  Age related decreased brain function (dementia).  Heightened emotional states like rage or terror.  Mental illness in which the person loses the ability to determine what is real and what is not (hallucinations).  Infections such as a urinary tract infection (UTI).  Toxic effects from alcohol, drugs, or prescription medicines.  Dehydration and an imbalance of salts in the body (electrolytes).  Lack of sleep.  Low blood sugar (diabetes).  Low levels of oxygen from conditions such as chronic lung disorders.  Drug interactions or other medicine side effects.  Nutritional deficiencies, especially niacin, thiamine, vitamin C, or vitamin B.  Sudden drop in body temperature (hypothermia).  Change in routine, such as when traveling or hospitalized. SIGNS AND SYMPTOMS  People often describe their thinking as cloudy or unclear when they are  confused. Confusion can also include feeling disoriented. That means you are unaware of where or who you are. You may also not know what the date or time is. If confused, you may also have difficulty paying attention, remembering, and making decisions. Some people also act aggressively when they are confused.  DIAGNOSIS  The medical evaluation of confusion may include:  Blood and urine tests.  X-rays.  Brain and nervous system tests.  Analyzing your brain waves (electroencephalogram or EEG).  Magnetic resonance imaging (MRI) of your head.  Computed tomography (CT) scan of your head.  Mental status tests in which your health care provider may ask many questions. Some of these questions may seem silly or strange, but they are a very important test to help diagnose and treat confusion. TREATMENT  An admission to the hospital may not be needed, but a person with confusion should not be left alone. Stay with a family member or friend until the confusion clears. Avoid alcohol, pain relievers, or sedative drugs until you have fully recovered. Do not drive until directed by your health care provider. HOME CARE INSTRUCTIONS  What family and friends can do:  To find out if someone is confused, ask the person to state his or her name, age, and the date. If the person is unsure or answers incorrectly, he or she is confused.  Always introduce yourself, no matter how well  the person knows you.  Often remind the person of his or her location.  Place a calendar and clock near the confused person.  Help the person with his or her medicines. You may want to use a pill box, an alarm as a reminder, or give the person each dose as prescribed.  Talk about current events and plans for the day.  Try to keep the environment calm, quiet, and peaceful.  Make sure the person keeps follow-up visits with his or her health care provider. PREVENTION  Ways to prevent confusion:  Avoid alcohol.  Eat a  balanced diet.  Get enough sleep.  Take medicine only as directed by your health care provider.  Do not become isolated. Spend time with other people and make plans for your days.  Keep careful watch on your blood sugar levels if you are diabetic. SEEK IMMEDIATE MEDICAL CARE IF:   You develop severe headaches, repeated vomiting, seizures, blackouts, or slurred speech.  There is increasing confusion, weakness, numbness, restlessness, or personality changes.  You develop a loss of balance, have marked dizziness, feel uncoordinated, or fall.  You have delusions, hallucinations, or develop severe anxiety.  Your family members think you need to be rechecked. Document Released: 01/27/2004 Document Revised: 05/05/2013 Document Reviewed: 01/24/2013 [facility] Patient Information 2015 [place], [place]. This information is not intended to replace advice given to you by your health care provider. Make sure you discuss any questions you have with your health care provider.

## 2014-08-26 NOTE — ED Notes (Signed)
Per [facility], pt from home.  Pt c/o "not feeling right" feeling weak for past two days and just wanted to be checked out.  Not sleeping well. VSS.  Ambulates independently.

## 2014-08-26 NOTE — ED Notes (Signed)
Pt discharged to home with daughter via private vehicle.  Pt A/O and verbalized understanding of discharge and follow up care.  Belongings sent home with pt.

## 2014-08-26 NOTE — ED Provider Notes (Signed)
CSN: [id number]     Arrival date & time 08/26/14  8756 History   First MD Initiated Contact with Patient 08/26/14 [phone]     Chief Complaint  Patient presents with  . Weakness     (Consider location/radiation/quality/duration/timing/severity/associated sxs/prior Treatment) HPI Patient has just not felt quite right for the past couple of days. She states she's had weird dreams and she is not sleeping well. She has not had any associated pain. There's been no fever. Her family member reports that she has good days and bad days. She reports some day she is alert and active with very clear mental status, other days she may have problems with confusion and memory loss. She just recently started on Aricept to try to improve her memory loss issues. The patient and the family member seem to associate the changes to her initiation of Aricept. Past Medical History  Diagnosis Date  . Hypertension   . Hyperlipidemia   . Onychomycosis of toenail   . Hard of hearing   . Memory loss    History reviewed. No pertinent past surgical history. History reviewed. No pertinent family history. Social History  Substance Use Topics  . Smoking status: Former Research scientist (life sciences)  . Smokeless tobacco: Never Used  . Alcohol Use: No   OB History    No data available     Review of Systems  10 Systems reviewed and are negative for acute change except as noted in the HPI.   Allergies  Penicillins  Home Medications   Prior to Admission medications   Medication Sig Start Date End Date Taking? Authorizing Provider  amLODipine-olmesartan (AZOR) 10-40 MG per tablet Take 1 tablet by mouth daily.   Yes Historical Provider, MD  aspirin 81 MG tablet Take 81 mg by mouth daily.   Yes Historical Provider, MD  atorvastatin (LIPITOR) 10 MG tablet Take 10 mg by mouth daily.   Yes Historical Provider, MD  bisoprolol (ZEBETA) 5 MG tablet Take 5 mg by mouth daily.   Yes Historical Provider, MD  clorazepate (TRANXENE) 3.75 MG tablet  Take 3.75 mg by mouth daily.    Yes Historical Provider, MD  donepezil (ARICEPT) 5 MG tablet Take 5 mg by mouth daily. 08/13/14  Yes Historical Provider, MD  metoprolol succinate (TOPROL-XL) 25 MG 24 hr tablet Take 25 mg by mouth daily.   Yes Historical Provider, MD  MYRBETRIQ 50 MG TB24 tablet Take 50 mg by mouth daily.  02/25/13  Yes Historical Provider, MD  polyethylene glycol (MIRALAX / GLYCOLAX) packet Take 17 g by mouth daily.   Yes Historical Provider, MD  solifenacin (VESICARE) 5 MG tablet Take 5 mg by mouth daily.    Yes Historical Provider, MD   BP 151/55 mmHg  Pulse 58  Temp(Src) 97.6 F (36.4 C) (Oral)  Resp 16  SpO2 97% Physical Exam  Constitutional: She is oriented to person, place, and time. She appears well-developed and well-nourished.  HENT:  Head: Normocephalic and atraumatic.  Eyes: EOM are normal. Pupils are equal, round, and reactive to light.  Neck: Neck supple.  Cardiovascular: Normal rate, regular rhythm, normal heart sounds and intact distal pulses.   Pulmonary/Chest: Effort normal and breath sounds normal.  Abdominal: Soft. Bowel sounds are normal. She exhibits no distension. There is no tenderness.  Musculoskeletal: Normal range of motion. She exhibits no edema.  Neurological: She is alert and oriented to person, place, and time. She has normal strength. Coordination normal. GCS eye subscore is 4. GCS verbal subscore is  5. GCS motor subscore is 6.  Skin: Skin is warm, dry and intact.  Psychiatric: She has a normal mood and affect.    ED Course  Procedures (including critical care time) Labs Review Labs Reviewed  I-STAT CHEM 8, ED - Abnormal; Notable for the following:    Chloride 99 (*)    Glucose, Bld 100 (*)    All other components within normal limits  CBC WITH DIFFERENTIAL/PLATELET  URINALYSIS, ROUTINE W REFLEX MICROSCOPIC (NOT AT [facility])  BASIC METABOLIC PANEL    Imaging Review No results found. I have personally reviewed and evaluated these  images and lab results as part of my medical decision-making.   EKG Interpretation None     10:43 Patient's lab changed to i-STAT due to chemistry machine being out of order at this time. Awaiting results of UA and now i-STAT to make disposition. MDM   Final diagnoses:  Malaise and fatigue  Bad dreams   Patient has very well appearance. Diagnostic studies are within normal limits. The symptoms appear to be concurrent with the initiation of Aricept. Thus I will have them discontinue the Aricept and discuss his ongoing use with the family physician.    Charlesetta Shanks, MD 08/26/14 1116

## 2014-10-12 ENCOUNTER — Encounter: Payer: Self-pay | Admitting: Podiatry

## 2014-10-12 ENCOUNTER — Ambulatory Visit (INDEPENDENT_AMBULATORY_CARE_PROVIDER_SITE_OTHER): Payer: Medicare Other | Admitting: Podiatry

## 2014-10-12 VITALS — BP 140/71 | HR 66

## 2014-10-12 DIAGNOSIS — M2041 Other hammer toe(s) (acquired), right foot: Secondary | ICD-10-CM

## 2014-10-12 DIAGNOSIS — B351 Tinea unguium: Secondary | ICD-10-CM

## 2014-10-12 DIAGNOSIS — L97519 Non-pressure chronic ulcer of other part of right foot with unspecified severity: Secondary | ICD-10-CM | POA: Insufficient documentation

## 2014-10-12 DIAGNOSIS — M2040 Other hammer toe(s) (acquired), unspecified foot: Secondary | ICD-10-CM

## 2014-10-12 DIAGNOSIS — M79606 Pain in leg, unspecified: Secondary | ICD-10-CM

## 2014-10-12 DIAGNOSIS — L97511 Non-pressure chronic ulcer of other part of right foot limited to breakdown of skin: Secondary | ICD-10-CM

## 2014-10-12 NOTE — Progress Notes (Signed)
Subjective:  81 year old female presents complaining of pain in right 4th digit and has it covered with bandage.   Objective:  Hypertrophic nails x 10.  Bled digital corns at distal end of 4th digits bilateral. Pedal pulses not palpable due to edema.  Positive of mild pedal edema.  Contracted lesser digits 2-5 bilateral.   Assessment:  Ulcerating corn 4th digit right. Multiple hammer toe deformity bilateral. Mycotic nails x 10.  PVD.   Plan:  All nails debrided. Corns debrided and padded.  Right 4th digit placed with aperture pad and dispensed removable buttress pad x 2 with instruction to take them off at night and place back under toe toes in the morning.  Return as needed.

## 2014-12-16 ENCOUNTER — Encounter: Payer: Self-pay | Admitting: Podiatry

## 2014-12-16 ENCOUNTER — Ambulatory Visit (INDEPENDENT_AMBULATORY_CARE_PROVIDER_SITE_OTHER): Payer: Medicare Other | Admitting: Podiatry

## 2014-12-16 DIAGNOSIS — B351 Tinea unguium: Secondary | ICD-10-CM | POA: Diagnosis not present

## 2014-12-16 DIAGNOSIS — M79606 Pain in leg, unspecified: Secondary | ICD-10-CM | POA: Diagnosis not present

## 2014-12-16 DIAGNOSIS — L03032 Cellulitis of left toe: Secondary | ICD-10-CM | POA: Diagnosis not present

## 2014-12-16 DIAGNOSIS — L600 Ingrowing nail: Secondary | ICD-10-CM | POA: Diagnosis not present

## 2014-12-16 NOTE — Patient Instructions (Signed)
Seen for hypertrophic nails, ingrown nails, and painful corns.. All nails debrided and removed ingrown nail. Buttress pad placed under 2nd and 3rd bilateral. Return as needed.

## 2014-12-16 NOTE — Progress Notes (Signed)
Subjective:  81 year old female presents complaining of pain in left great toe and right 4th digit that are covered with bandaids.   Objective:  Paronychia with ingrown nail and red swollen toe left hallux medial border. Hypertrophic nails x 10.  Bled digital corns at distal end of 4th digits bilateral. Pedal pulses not palpable due to edema.  Positive of mild pedal edema.  Contracted lesser digits 2-5 bilateral.   Assessment:  Ingrown nail with inflamed hallux left. Ulcerating corn 4th digit right. Multiple hammer toe deformity bilateral. Mycotic nails x 10.  PVD.   Plan:  All nails debrided. Corns debrided and buttress pad placed under 2nd and 3rd bilateral with instruction to take them off at night and place back under toe toes in the morning.  Ingrown nail removed and Betadine cleansing and dressing applied left great toe medial border. Return as needed.

## 2014-12-24 ENCOUNTER — Encounter (HOSPITAL_COMMUNITY): Payer: Self-pay | Admitting: Emergency Medicine

## 2014-12-24 ENCOUNTER — Emergency Department (HOSPITAL_COMMUNITY): Payer: Medicare Other

## 2014-12-24 ENCOUNTER — Emergency Department (HOSPITAL_COMMUNITY)
Admission: EM | Admit: 2014-12-24 | Discharge: 2014-12-25 | Disposition: A | Discharge status: Home / Self Care | Payer: Medicare Other | Attending: Emergency Medicine | Admitting: Emergency Medicine

## 2014-12-24 DIAGNOSIS — Z87891 Personal history of nicotine dependence: Secondary | ICD-10-CM | POA: Diagnosis not present

## 2014-12-24 DIAGNOSIS — Y92009 Unspecified place in unspecified non-institutional (private) residence as the place of occurrence of the external cause: Secondary | ICD-10-CM | POA: Insufficient documentation

## 2014-12-24 DIAGNOSIS — I10 Essential (primary) hypertension: Secondary | ICD-10-CM | POA: Insufficient documentation

## 2014-12-24 DIAGNOSIS — H9190 Unspecified hearing loss, unspecified ear: Secondary | ICD-10-CM | POA: Diagnosis not present

## 2014-12-24 DIAGNOSIS — Z7982 Long term (current) use of aspirin: Secondary | ICD-10-CM | POA: Insufficient documentation

## 2014-12-24 DIAGNOSIS — Z79899 Other long term (current) drug therapy: Secondary | ICD-10-CM | POA: Insufficient documentation

## 2014-12-24 DIAGNOSIS — T148 Other injury of unspecified body region: Secondary | ICD-10-CM | POA: Diagnosis not present

## 2014-12-24 DIAGNOSIS — Z880 Allergy status to penicillin: Secondary | ICD-10-CM | POA: Insufficient documentation

## 2014-12-24 DIAGNOSIS — Z8619 Personal history of other infectious and parasitic diseases: Secondary | ICD-10-CM | POA: Diagnosis not present

## 2014-12-24 DIAGNOSIS — Y9389 Activity, other specified: Secondary | ICD-10-CM | POA: Diagnosis not present

## 2014-12-24 DIAGNOSIS — S52512A Displaced fracture of left radial styloid process, initial encounter for closed fracture: Secondary | ICD-10-CM | POA: Diagnosis not present

## 2014-12-24 DIAGNOSIS — W06XXXA Fall from bed, initial encounter: Secondary | ICD-10-CM | POA: Insufficient documentation

## 2014-12-24 DIAGNOSIS — E785 Hyperlipidemia, unspecified: Secondary | ICD-10-CM | POA: Insufficient documentation

## 2014-12-24 DIAGNOSIS — S52502A Unspecified fracture of the lower end of left radius, initial encounter for closed fracture: Secondary | ICD-10-CM

## 2014-12-24 DIAGNOSIS — Y998 Other external cause status: Secondary | ICD-10-CM | POA: Insufficient documentation

## 2014-12-24 DIAGNOSIS — S6992XA Unspecified injury of left wrist, hand and finger(s), initial encounter: Secondary | ICD-10-CM | POA: Diagnosis present

## 2014-12-24 DIAGNOSIS — S52612A Displaced fracture of left ulna styloid process, initial encounter for closed fracture: Secondary | ICD-10-CM | POA: Diagnosis not present

## 2014-12-24 DIAGNOSIS — M25532 Pain in left wrist: Secondary | ICD-10-CM | POA: Diagnosis not present

## 2014-12-24 NOTE — ED Notes (Signed)
Per EMS- pt had a mechanical fall on her left side, specifically on left wrist. Significant left wrist swelling. Denies LOC, pain anywhere else. Not on blood thinners. A&Ox4. No history of dementia. VSS. (from home)

## 2014-12-24 NOTE — ED Provider Notes (Signed)
CSN: [id number]     Arrival date & time 12/24/14  2045 History  By signing my name below, I, Altamease Oiler, attest that this documentation has been prepared under the direction and in the presence of [employer]. Electronically Signed: Altamease Oiler, ED Scribe. 12/24/2014 11:08 PM  Chief Complaint  Patient presents with  . Fall  . Wrist Injury    The history is provided by the patient and a relative. No language interpreter was used.   Brought in by EMS from home, Cindy Hayes is a 81 y.o. female who presents to the Emergency Department complaining of a fall from bed tonight at home.The pt leaned over from bed to look for something and fell landing on her left arm and wrist. Associated symptoms include left wrist pain and swelling. Pt denies head injury, LOC, and any other injury.   Past Medical History  Diagnosis Date  . Hypertension   . Hyperlipidemia   . Onychomycosis of toenail   . Hard of hearing   . Memory loss    History reviewed. No pertinent past surgical history. History reviewed. No pertinent family history. Social History  Substance Use Topics  . Smoking status: Former Research scientist (life sciences)  . Smokeless tobacco: Never Used  . Alcohol Use: No   OB History    No data available     Review of Systems All other systems negative except as documented in the HPI. All pertinent positives and negatives as reviewed in the HPI.  Allergies  Penicillins  Home Medications   Prior to Admission medications   Medication Sig Start Date End Date Taking? Authorizing Provider  amLODipine-olmesartan (AZOR) 10-40 MG per tablet Take 1 tablet by mouth daily.   Yes Historical Provider, MD  aspirin 81 MG tablet Take 81 mg by mouth daily.   Yes Historical Provider, MD  atorvastatin (LIPITOR) 10 MG tablet Take 10 mg by mouth daily.   Yes Historical Provider, MD  bisoprolol (ZEBETA) 5 MG tablet Take 5 mg by mouth daily.   Yes Historical Provider, MD  clorazepate (TRANXENE) 3.75 MG  tablet Take 3.75 mg by mouth daily.    Yes Historical Provider, MD  metoprolol succinate (TOPROL-XL) 25 MG 24 hr tablet Take 25 mg by mouth daily.   Yes Historical Provider, MD  MYRBETRIQ 50 MG TB24 tablet Take 50 mg by mouth daily.  02/25/13  Yes Historical Provider, MD  solifenacin (VESICARE) 5 MG tablet Take 5 mg by mouth daily.    Yes Historical Provider, MD   BP 136/60 mmHg  Pulse 67  Temp(Src) 97.4 F (36.3 C) (Oral)  Resp 15  SpO2 100% Physical Exam  Constitutional: She is oriented to person, place, and time. She appears well-developed and well-nourished. No distress.  HENT:  Head: Normocephalic and atraumatic.  Eyes: EOM are normal.  Neck: Normal range of motion.  Cardiovascular: Normal rate, regular rhythm and normal heart sounds.   Pulmonary/Chest: Effort normal and breath sounds normal.  Abdominal: Soft. She exhibits no distension. There is no tenderness.  Musculoskeletal:  Obvious deformity of left wrist with dorsal angulation. Pulses intact.  Normal sensation.  Capillary refill less than 2 seconds.   Neurological: She is alert and oriented to person, place, and time.  Skin: Skin is warm and dry.  Psychiatric: She has a normal mood and affect. Judgment normal.  Nursing note and vitals reviewed.   ED Course  Procedures (including critical care time) DIAGNOSTIC STUDIES: Oxygen Saturation is 100% on RA, normal by my  interpretation.    COORDINATION OF CARE: 10:08 PM Discussed treatment plan which includes XRs of the left wrist and forearm  with pt at bedside and pt agreed to plan.  11:06 PM-Consult complete with Dr. Grandville Silos (Hand Surgery). Patient case explained and discussed.   Labs Review Labs Reviewed - No data to display  Imaging Review Dg Forearm Left  12/24/2014  CLINICAL DATA:  Patient was sitting on side of bed at her home and leaned over and fell off bed, unwitnessed fall,obvious deformity to wrist EXAM: LEFT FOREARM - 2 VIEW COMPARISON:  None.  FINDINGS: There is a mildly impacted and dorsally displaced fracture of the distal radial metaphysis with associated ulnar styloid fracture. No other fractures. Bones are diffusely demineralized. Wrist and elbow joints are normally aligned. There is diffuse distal forearm and wrist soft tissue edema. IMPRESSION: Fractures of the distal left radius and ulnar styloid. Electronically Signed   By: Lajean Manes M.D.   On: 12/24/2014 21:40   Dg Wrist Complete Left  12/24/2014  CLINICAL DATA:  Fall from bed with wrist deformity. Initial encounter. EXAM: LEFT WRIST - COMPLETE 3+ VIEW COMPARISON:  None. FINDINGS: Acute transverse extra-articular fracture through the distal radial metaphysis with 50% posterior displacement and dorsal wrist tilting. Mild acquired ulnar positive variance. Probable nondisplaced fracture through the base of the ulnar styloid. Carpal alignment is normal. Profound osteopenia. IMPRESSION: 1. Displaced distal radius fracture as described above. 2. Probable nondisplaced ulnar styloid fracture. Electronically Signed   By: Monte Fantasia M.D.   On: 12/24/2014 21:42   I personally reviewed and evaluated these images as a part of my medical decision-making.  I personally performed the services described in this documentation, which was scribed in my presence. The recorded information has been reviewed and is accurate.    Dalia Heading, PA-C [id number]  Delora Fuel, MD [id number]

## 2014-12-24 NOTE — ED Notes (Signed)
Bed: WA06 Expected date:  Expected time:  Means of arrival:  Comments: EMS 81yo F fall / wrist swelling and pain

## 2014-12-25 DIAGNOSIS — S53095A Other dislocation of left radial head, initial encounter: Secondary | ICD-10-CM | POA: Diagnosis not present

## 2014-12-25 MED ORDER — HYDROCODONE-ACETAMINOPHEN 5-325 MG PO TABS: 0.5000 | ORAL_TABLET | Freq: Four times a day (QID) | ORAL | Status: DC | PRN

## 2014-12-25 NOTE — ED Notes (Addendum)
[facility] called for transport.  

## 2014-12-25 NOTE — Discharge Instructions (Signed)
Return here as needed. Follow up with the hand surgeon.

## 2014-12-29 DIAGNOSIS — S52531A Colles' fracture of right radius, initial encounter for closed fracture: Secondary | ICD-10-CM | POA: Diagnosis not present

## 2015-01-06 DIAGNOSIS — M25532 Pain in left wrist: Secondary | ICD-10-CM | POA: Diagnosis not present

## 2015-01-07 ENCOUNTER — Telehealth: Payer: Self-pay | Admitting: *Deleted

## 2015-01-07 ENCOUNTER — Emergency Department (HOSPITAL_COMMUNITY)
Admission: EM | Admit: 2015-01-07 | Discharge: 2015-01-07 | Disposition: A | Discharge status: Home / Self Care | Payer: Medicare Other | Attending: Emergency Medicine | Admitting: Emergency Medicine

## 2015-01-07 ENCOUNTER — Encounter (HOSPITAL_COMMUNITY): Payer: Self-pay | Admitting: *Deleted

## 2015-01-07 DIAGNOSIS — Z7982 Long term (current) use of aspirin: Secondary | ICD-10-CM | POA: Diagnosis not present

## 2015-01-07 DIAGNOSIS — Z880 Allergy status to penicillin: Secondary | ICD-10-CM | POA: Insufficient documentation

## 2015-01-07 DIAGNOSIS — Z0000 Encounter for general adult medical examination without abnormal findings: Secondary | ICD-10-CM | POA: Diagnosis not present

## 2015-01-07 DIAGNOSIS — Z87891 Personal history of nicotine dependence: Secondary | ICD-10-CM | POA: Diagnosis not present

## 2015-01-07 DIAGNOSIS — Z79899 Other long term (current) drug therapy: Secondary | ICD-10-CM | POA: Insufficient documentation

## 2015-01-07 DIAGNOSIS — Z8619 Personal history of other infectious and parasitic diseases: Secondary | ICD-10-CM | POA: Diagnosis not present

## 2015-01-07 DIAGNOSIS — R54 Age-related physical debility: Secondary | ICD-10-CM | POA: Diagnosis not present

## 2015-01-07 DIAGNOSIS — E785 Hyperlipidemia, unspecified: Secondary | ICD-10-CM | POA: Diagnosis not present

## 2015-01-07 DIAGNOSIS — H9190 Unspecified hearing loss, unspecified ear: Secondary | ICD-10-CM | POA: Diagnosis not present

## 2015-01-07 DIAGNOSIS — Z5189 Encounter for other specified aftercare: Secondary | ICD-10-CM | POA: Diagnosis not present

## 2015-01-07 NOTE — NC FL2 (Signed)
  [place] LEVEL OF CARE SCREENING TOOL     IDENTIFICATION  Patient Name: MISK Orra Birthdate: [birth year] Sex: female Admission Date (Current Location): 01/07/2015  [facility] and [place] Number:  Herbalist and Address:  [facility],  [place] [address] [place]      Provider Number: [id number]  Attending Physician Name and Address:  No att. providers found  Relative Name and Phone Number:       Current Level of Care: Hospital Recommended Level of Care: [place] Prior Approval Number:    Date Approved/Denied:   PASRR Number:    Discharge Plan: SNF    Current Diagnoses: Patient Active Problem List   Diagnosis Date Noted  . Paronychia of great toe of left foot 12/16/2014  . Ulcer of toe of right foot ([place]) 10/12/2014  . Ingrown nail 02/27/2014  . Onychomycosis 03/18/2013  . Pain in lower limb 03/18/2013  . Acquired hammer toe 03/18/2013  . Corns 04/16/2012  . Nail abnormalities 03/19/2012    Orientation RESPIRATION BLADDER Height & Weight    Self, Place   (Room Air)     136 lbs.  BEHAVIORAL SYMPTOMS/MOOD NEUROLOGICAL BOWEL NUTRITION STATUS         (Per EDP, well nourished)  AMBULATORY STATUS COMMUNICATION OF NEEDS Skin    (Per nurse, patient is ambulatory) Verbally (Per nurse, patient exhibits confusion) Normal                       Personal Care Assistance Level of Assistance          Total Care Assistance: Limited assistance   Functional Limitations Info  Hearing (Patient has difficulty hearing)   Hearing Info: Impaired      SPECIAL CARE FACTORS FREQUENCY                       Contractures      Additional Factors Info  Code Status, Allergies Code Status Info:  (Not on file) Allergies Info:  (Penicillins)           Current Medications (01/07/2015):  This is the current hospital active medication list No current facility-administered medications for this encounter.    Current Outpatient Prescriptions  Medication Sig Dispense Refill  . amLODipine-olmesartan (AZOR) 10-40 MG per tablet Take 1 tablet by mouth daily.    Marland Kitchen aspirin 81 MG tablet Take 81 mg by mouth daily.    Marland Kitchen atorvastatin (LIPITOR) 10 MG tablet Take 10 mg by mouth daily.    . bisoprolol (ZEBETA) 5 MG tablet Take 5 mg by mouth daily.    . clorazepate (TRANXENE) 3.75 MG tablet Take 3.75 mg by mouth daily.     Marland Kitchen HYDROcodone-acetaminophen (NORCO/VICODIN) 5-325 MG tablet Take 0.5 tablets by mouth every 6 (six) hours as needed for moderate pain. 10 tablet 0  . metoprolol succinate (TOPROL-XL) 25 MG 24 hr tablet Take 25 mg by mouth daily.    Marland Kitchen MYRBETRIQ 50 MG TB24 tablet Take 50 mg by mouth daily.     . solifenacin (VESICARE) 5 MG tablet Take 5 mg by mouth daily.        Discharge Medications: Please see discharge summary for a list of discharge medications.  Relevant Imaging Results:  Relevant Lab Results:   Additional Information    Guadelupe Sabin, LCSW
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
## 2015-01-07 NOTE — NC FL2 (Signed)
  [place] LEVEL OF CARE SCREENING TOOL     IDENTIFICATION  Patient Name: Cindy Hayes Birthdate: 1913-10-12 Sex: female Admission Date (Current Location): 01/07/2015  [facility] and [place] Number:  Herbalist and Address:  [facility],  [place] [address] [place]      Provider Number: [id number]  Attending Physician Name and Address:  No att. providers found  Relative Name and Phone Number:       Current Level of Care: Hospital Recommended Level of Care: [place] Prior Approval Number:    Date Approved/Denied:   PASRR Number:    Discharge Plan: SNF    Current Diagnoses: Patient Active Problem List   Diagnosis Date Noted  . Paronychia of great toe of left foot 12/16/2014  . Ulcer of toe of right foot ([place]) 10/12/2014  . Ingrown nail 02/27/2014  . Onychomycosis 03/18/2013  . Pain in lower limb 03/18/2013  . Acquired hammer toe 03/18/2013  . Corns 04/16/2012  . Nail abnormalities 03/19/2012    Orientation RESPIRATION BLADDER Height & Weight    Self, Place   (Room Air)     136 lbs.  BEHAVIORAL SYMPTOMS/MOOD NEUROLOGICAL BOWEL NUTRITION STATUS         (Per EDP, well nourished)  AMBULATORY STATUS COMMUNICATION OF NEEDS Skin    (Per nurse, patient is ambulatory) Verbally (Per nurse, patient exhibits confusion) Normal                       Personal Care Assistance Level of Assistance          Total Care Assistance: Limited assistance   Functional Limitations Info  Hearing (Patient has difficulty hearing)   Hearing Info: Impaired      SPECIAL CARE FACTORS FREQUENCY                       Contractures      Additional Factors Info  Code Status, Allergies Code Status Info:  (Not on file) Allergies Info:  (Penicillins)           Current Medications (01/07/2015):  This is the current hospital active medication list No current facility-administered medications for this encounter.    Current Outpatient Prescriptions  Medication Sig Dispense Refill  . amLODipine-olmesartan (AZOR) 10-40 MG per tablet Take 1 tablet by mouth daily.    Marland Kitchen aspirin 81 MG tablet Take 81 mg by mouth daily.    Marland Kitchen atorvastatin (LIPITOR) 10 MG tablet Take 10 mg by mouth daily.    . bisoprolol (ZEBETA) 5 MG tablet Take 5 mg by mouth daily.    . clorazepate (TRANXENE) 3.75 MG tablet Take 3.75 mg by mouth daily.     Marland Kitchen HYDROcodone-acetaminophen (NORCO/VICODIN) 5-325 MG tablet Take 0.5 tablets by mouth every 6 (six) hours as needed for moderate pain. 10 tablet 0  . metoprolol succinate (TOPROL-XL) 25 MG 24 hr tablet Take 25 mg by mouth daily.    Marland Kitchen MYRBETRIQ 50 MG TB24 tablet Take 50 mg by mouth daily.     . solifenacin (VESICARE) 5 MG tablet Take 5 mg by mouth daily.        Discharge Medications: Please see discharge summary for a list of discharge medications.  Relevant Imaging Results:  Relevant Lab Results:   Additional Information    Guadelupe Sabin, LCSW

## 2015-01-07 NOTE — ED Notes (Signed)
Patient is from home with no medical complaints. Her family is looking for nursing home placement and was told that she should be sent to the ER "because we can get this done quicker than her PCP". The patient's PCP is aware of the family's desire and is willing to initiate this process, but the family did not want to wait. Patient is totally alert and oriented times 4. She was ambulatory on scene without assistance. The patient fractured her wrist 2 weeks ago and has been seen and treated for this. Patient's daughter in  [place] wants her placed today.

## 2015-01-07 NOTE — Progress Notes (Signed)
Pt stated she had been looking at nursing home cause she had visited some of her friends at nursing homes but states she can not afford to pay the cost to go to a nursing home She states is willing to go to nursing home if she could pay.  Pt nor Female family unable to provide Cm with the choice of home health agency prior to d/c.  Referred to pcp Cm informed female family that pt not qualifying to be placed in facility from ED ED charge RN informed Cm pt walked to EMS stretcher independently  Pt when assessed for orientation told Cm who she is,  "Mady Gemma is president", "I'm one 81" "I am at [facility] [facility] where I gave birth to all my sons." " my doctor told be to come here" but unable to tell Cm how she injured her left arm Pt unable to tell Cm the time or day Oriented to person, place but not time and partial situation.

## 2015-01-07 NOTE — Progress Notes (Signed)
CSW completed FL2 and form was signed by EDP. CSW will provide FL2 to liason for [employer]. CSW spoke with liason for [employer]. Liason stated she will pick up FL2. Liason informed PASSAR had not been completed at this time.   Genice Rouge [id number] ED CSW 01/07/2015 2:54 PM

## 2015-01-07 NOTE — Progress Notes (Signed)
ED CM consulted by ED charge RN after [place] spoke with pt with c/o from home with no medical complaints. Her family is looking for nursing home placement and was told that she should be sent to the ER "because we can get this done quicker than her PCP".   ED SW had received a call from [place] from a snf about this prior to ED charge RN calling   ED SW and ED CM to speak with pt & Family

## 2015-01-07 NOTE — Progress Notes (Signed)
CSW met with patient and lady with patient along with Nurse CM. Nurse CM spoke with patient and the lady regarding patient.   CSW received call from Asst. SW Director stating to complete an FL2 on patient to assist [facility] with assistance for placement purposes. CSW completed FL2 and currently waiting for doctor to sign.  Genice Rouge [phone] ED CSW 01/07/2015 2:38 PM

## 2015-01-07 NOTE — Progress Notes (Addendum)
CM reviewed in details medicare guidelines, home health [facility]) (length of stay in home, types of [facility] staff available, coverage, primary caregiver, up to 24 hrs before services may be started) and Private duty nursing (PDN-coverage, length of stay in the home types of staff available). CM reviewed availability of [place] SW to assist pcp to get pt to snf (if desired disposition) from the community level. CM provided pt/family with a list of [place] home health agencies and PDN.  CM had to ask female family member to be escorted away from pt as Cm was assessing pt because Female family member continued to answer pt questions negatively even after Cm informed her CM needed to ask pt questions and get pt's responses Female making noises of crying but no tears at intervals Female states she went to speak with pt son in lobby States pt has 3 living sons and pt is not able to live with any of them One son lives in [place]. One son lives in "the basement" of someone else's home.  Pt stated this son "has no money to even take care of himself" Female at bedside states the son that is her husband lives locally but pt could not stay with them "we have no where to put her" Pt states a female comes "on wednesdays" "maybe every other week to clean her house and to pay bills" Pt states she is HOH Cm got close to left ear but pt had trouble hearing Cm noted when Cm stood up pt able to hear better and when female family member sitting in chair to left of pt she was able to speak at a regular tone and pt was able to hear her,    Discussed that [facility] paid for by her insurance carrier and PDN paid for by long term care medicaid or out of pocket [place] [facility] [place] updated orders written for [facility], PT SW  ED CM sent PCP an in basket message via EPIC

## 2015-01-08 DIAGNOSIS — S52502D Unspecified fracture of the lower end of left radius, subsequent encounter for closed fracture with routine healing: Secondary | ICD-10-CM | POA: Diagnosis not present

## 2015-01-08 DIAGNOSIS — I10 Essential (primary) hypertension: Secondary | ICD-10-CM | POA: Diagnosis not present

## 2015-01-08 DIAGNOSIS — S52531D Colles' fracture of right radius, subsequent encounter for closed fracture with routine healing: Secondary | ICD-10-CM | POA: Diagnosis not present

## 2015-01-08 DIAGNOSIS — N393 Stress incontinence (female) (male): Secondary | ICD-10-CM | POA: Diagnosis not present

## 2015-01-08 DIAGNOSIS — E039 Hypothyroidism, unspecified: Secondary | ICD-10-CM | POA: Diagnosis not present

## 2015-01-08 DIAGNOSIS — M150 Primary generalized (osteo)arthritis: Secondary | ICD-10-CM | POA: Diagnosis not present

## 2015-01-08 DIAGNOSIS — R739 Hyperglycemia, unspecified: Secondary | ICD-10-CM | POA: Diagnosis not present

## 2015-01-08 DIAGNOSIS — K5790 Diverticulosis of intestine, part unspecified, without perforation or abscess without bleeding: Secondary | ICD-10-CM | POA: Diagnosis not present

## 2015-01-08 DIAGNOSIS — M1990 Unspecified osteoarthritis, unspecified site: Secondary | ICD-10-CM | POA: Diagnosis not present

## 2015-01-08 DIAGNOSIS — E785 Hyperlipidemia, unspecified: Secondary | ICD-10-CM | POA: Diagnosis not present

## 2015-01-08 DIAGNOSIS — F0390 Unspecified dementia without behavioral disturbance: Secondary | ICD-10-CM | POA: Diagnosis not present

## 2015-01-08 DIAGNOSIS — E038 Other specified hypothyroidism: Secondary | ICD-10-CM | POA: Diagnosis not present

## 2015-01-12 ENCOUNTER — Non-Acute Institutional Stay (SKILLED_NURSING_FACILITY): Payer: Medicare Other | Admitting: Adult Health

## 2015-01-12 DIAGNOSIS — E038 Other specified hypothyroidism: Secondary | ICD-10-CM | POA: Diagnosis not present

## 2015-01-12 DIAGNOSIS — F0390 Unspecified dementia without behavioral disturbance: Secondary | ICD-10-CM

## 2015-01-12 DIAGNOSIS — E785 Hyperlipidemia, unspecified: Secondary | ICD-10-CM

## 2015-01-12 DIAGNOSIS — N393 Stress incontinence (female) (male): Secondary | ICD-10-CM | POA: Diagnosis not present

## 2015-01-12 DIAGNOSIS — I10 Essential (primary) hypertension: Secondary | ICD-10-CM

## 2015-01-12 DIAGNOSIS — S52502D Unspecified fracture of the lower end of left radius, subsequent encounter for closed fracture with routine healing: Secondary | ICD-10-CM | POA: Diagnosis not present

## 2015-01-12 DIAGNOSIS — E034 Atrophy of thyroid (acquired): Secondary | ICD-10-CM | POA: Diagnosis not present

## 2015-01-14 ENCOUNTER — Encounter: Payer: Self-pay | Admitting: Internal Medicine

## 2015-01-14 ENCOUNTER — Non-Acute Institutional Stay (SKILLED_NURSING_FACILITY): Payer: Medicare Other | Admitting: Internal Medicine

## 2015-01-14 DIAGNOSIS — M159 Polyosteoarthritis, unspecified: Secondary | ICD-10-CM

## 2015-01-14 DIAGNOSIS — N393 Stress incontinence (female) (male): Secondary | ICD-10-CM

## 2015-01-14 DIAGNOSIS — E038 Other specified hypothyroidism: Secondary | ICD-10-CM

## 2015-01-14 DIAGNOSIS — S52502D Unspecified fracture of the lower end of left radius, subsequent encounter for closed fracture with routine healing: Secondary | ICD-10-CM | POA: Diagnosis not present

## 2015-01-14 DIAGNOSIS — F0390 Unspecified dementia without behavioral disturbance: Secondary | ICD-10-CM

## 2015-01-14 DIAGNOSIS — I10 Essential (primary) hypertension: Secondary | ICD-10-CM | POA: Diagnosis not present

## 2015-01-14 DIAGNOSIS — E785 Hyperlipidemia, unspecified: Secondary | ICD-10-CM | POA: Diagnosis not present

## 2015-01-14 DIAGNOSIS — E039 Hypothyroidism, unspecified: Secondary | ICD-10-CM | POA: Insufficient documentation

## 2015-01-14 DIAGNOSIS — E034 Atrophy of thyroid (acquired): Secondary | ICD-10-CM | POA: Diagnosis not present

## 2015-01-14 DIAGNOSIS — M150 Primary generalized (osteo)arthritis: Secondary | ICD-10-CM | POA: Diagnosis not present

## 2015-01-14 DIAGNOSIS — S52502A Unspecified fracture of the lower end of left radius, initial encounter for closed fracture: Secondary | ICD-10-CM | POA: Insufficient documentation

## 2015-01-14 DIAGNOSIS — M1990 Unspecified osteoarthritis, unspecified site: Secondary | ICD-10-CM | POA: Insufficient documentation

## 2015-01-14 HISTORY — DX: Unspecified dementia, unspecified severity, without behavioral disturbance, psychotic disturbance, mood disturbance, and anxiety: F03.90

## 2015-01-14 NOTE — Progress Notes (Signed)
MRN: [id number] Name: Cindy Hayes  Sex: female Age: 82 y.o. DOB: [birth year]  [place] #: Karren Burly Facility/Room:127B Level Of Care: SNF Provider: Inocencio Homes D Emergency Contacts: Extended Emergency Contact Information Primary Emergency Contact: Esperanza Heir Address: [address] [address] [place] Phone: [phone] Relation: Son Secondary Emergency Contact: Rennis Chris [place] of [place] Phone: [phone] Relation: Other  Code Status:   Allergies: Penicillins  Chief Complaint  Patient presents with  . New Admit To SNF    HPI: Patient is 82 y.o. female with HTN, HLD,OA, who is being admitted to SNF for care while her L  fore arm is fractured ans she is in a cast. While at SNF pt will be followed for HTN, tx with azor, toprol XL and zebeta, HLD, tx with lipitor and stress incontinence tx with myrbetric and vesicare.  Past Medical History  Diagnosis Date  . Onychomycosis of toenail   . Hard of hearing   . Memory loss   . Hyperlipidemia   . Hypertension   . Pedal edema   . Hyperkalemia   . Mitral regurgitation   . Diverticulosis     History reviewed. No pertinent past surgical history.    Medication List       This list is accurate as of: 01/14/15 11:59 PM.  Always use your most recent med list.               aspirin 81 MG tablet  Take 81 mg by mouth daily.     atorvastatin 10 MG tablet  Commonly known as:  LIPITOR  Take 10 mg by mouth daily.     AZOR 10-40 MG tablet  Generic drug:  amLODipine-olmesartan  Take 1 tablet by mouth daily.     bisoprolol 5 MG tablet  Commonly known as:  ZEBETA  Take 5 mg by mouth daily.     clorazepate 3.75 MG tablet  Commonly known as:  TRANXENE  Take 3.75 mg by mouth daily.     HYDROcodone-acetaminophen 5-325 MG tablet  Commonly known as:  NORCO/VICODIN  Take 0.5 tablets by mouth every 6 (six) hours as needed for moderate pain.     metoprolol  succinate 25 MG 24 hr tablet  Commonly known as:  TOPROL-XL  Take 25 mg by mouth daily.     MYRBETRIQ 50 MG Tb24 tablet  Generic drug:  mirabegron ER  Take 50 mg by mouth daily.     solifenacin 5 MG tablet  Commonly known as:  VESICARE  Take 5 mg by mouth daily.        No orders of the defined types were placed in this encounter.     There is no immunization history on file for this patient.  Social History  Substance Use Topics  . Smoking status: Former Research scientist (life sciences)  . Smokeless tobacco: Never Used  . Alcohol Use: No    Family history is UTO, pt said no to everything  Review of Systems  DATA OBTAINED: from patient,- limited some with dementia nurse GENERAL:  no fevers, fatigue, appetite changes SKIN: No itching, rash or wounds EYES: No eye pain, redness, discharge EARS: No earache, tinnitus, change in hearing NOSE: No congestion, drainage or bleeding  MOUTH/THROAT: No mouth or tooth pain, No sore throat RESPIRATORY: No cough, wheezing, SOB CARDIAC: No chest pain, palpitations, lower extremity edema  GI: No abdominal pain, No N/V/D or constipation, No heartburn or reflux  GU: No dysuria, frequency or urgency, or incontinence  MUSCULOSKELETAL: No unrelieved bone/joint pain NEUROLOGIC: No headache, dizziness or focal weakness PSYCHIATRIC: No c/o anxiety or sadness   Filed Vitals:   01/14/15 1327  BP: 127/53  Pulse: 72  Temp: 97 F (36.1 C)  Resp: 19    SpO2 Readings from Last 1 Encounters:  01/07/15 97%        Physical Exam  GENERAL APPEARANCE: Alert, conversant,  No acute distress.  SKIN: No diaphoresis rash HEAD: Normocephalic, atraumatic  EYES: Conjunctiva/lids clear. Pupils round, reactive. EOMs intact.  EARS: very HOH  NOSE: No deformity or discharge.  MOUTH/THROAT: Lips w/o lesions  RESPIRATORY: Breathing is even, unlabored. Lung sounds are clear   CARDIOVASCULAR: Heart RRR no murmurs, rubs or gallops. No peripheral edema.   GASTROINTESTINAL:  Abdomen is soft, non-tender, not distended w/ normal bowel sounds. GENITOURINARY: Bladder non tender, not distended  MUSCULOSKELETAL: l forearm /wrist in cast NEUROLOGIC:  Cranial nerves 2-12 grossly intact. Moves all extremities  PSYCHIATRIC: Mood and affect appropriate to situation with some dementia, no behavioral issues  Patient Active Problem List   Diagnosis Date Noted  . Dementia without behavioral disturbance 01/14/2015  . Hypothyroidism 01/14/2015  . Stress incontinence 01/14/2015  . OA (osteoarthritis) 01/14/2015  . Distal radius fracture, left 01/14/2015  . Hyperlipidemia   . Hypertension   . Paronychia of great toe of left foot 12/16/2014  . Ulcer of toe of right foot ([place]) 10/12/2014  . Ingrown nail 02/27/2014  . Onychomycosis 03/18/2013  . Pain in lower limb 03/18/2013  . Acquired hammer toe 03/18/2013  . Corns 04/16/2012  . Nail abnormalities 03/19/2012    CBC    Component Value Date/Time   WBC 6.8 08/26/2014 0952   RBC 4.29 08/26/2014 0952   HGB 13.9 08/26/2014 1102   HCT 41.0 08/26/2014 1102   PLT 191 08/26/2014 0952   MCV 88.8 08/26/2014 0952   LYMPHSABS 2.3 08/26/2014 0952   MONOABS 0.5 08/26/2014 0952   EOSABS 0.1 08/26/2014 0952   BASOSABS 0.0 08/26/2014 0952    CMP     Component Value Date/Time   NA 137 08/26/2014 1102   K 3.9 08/26/2014 1102   CL 99* 08/26/2014 1102   CO2 28 08/26/2014 0952   GLUCOSE 100* 08/26/2014 1102   BUN 14 08/26/2014 1102   CREATININE 0.80 08/26/2014 1102   CALCIUM 9.4 08/26/2014 0952   PROT 5.8* 02/04/2009 0528   ALBUMIN 3.1* 02/04/2009 0528   AST 26 02/04/2009 0528   ALT 18 02/04/2009 0528   ALKPHOS 63 02/04/2009 0528   BILITOT 1.1 02/04/2009 0528   GFRNONAA >60 08/26/2014 0952   GFRAA >60 08/26/2014 0952    Lab Results  Component Value Date   HGBA1C  02/04/2009    6.1 (NOTE) The ADA recommends the following therapeutic goal for glycemic control related to Hgb A1c measurement: Goal of therapy: <6.5 Hgb  A1c  Reference: American Diabetes Association: Clinical Practice Recommendations 2010, Diabetes Care, 2010, 33: (Suppl  1).     No results found.  Not all labs, radiology exams or other studies done during hospitalization come through on my EPIC note; however they are reviewed by me.    Assessment and Plan  Hypertension SNF - controlled on toprol XL 25 daily, azor, 10/40 and zebeta 5 mg; cont current meds  OA (osteoarthritis) Chronic; plan cont tylenol as needed  Hyperlipidemia Not stated as uncontrolled; cont lipitor 10 mg daily  Dementia without behavioral disturbance Pt on  no specific meds, mild-mod for someone her age; supportive care  Hypothyroidism H/o, pt currently on no meds  Distal radius fracture, left Occurred 12/22. Pt is casted and has lortab for  Pain prn.   Time spent > 45 min;> 50% of time with patient was spent reviewing records, labs, tests and studies, counseling and developing plan of care  Hennie Duos, MD

## 2015-01-17 ENCOUNTER — Encounter: Payer: Self-pay | Admitting: Internal Medicine

## 2015-01-17 NOTE — Assessment & Plan Note (Signed)
Occurred 12/22. Pt is casted and has lortab for  Pain prn.

## 2015-01-17 NOTE — Assessment & Plan Note (Signed)
Pt on no specific meds, mild-mod for someone her age; supportive care

## 2015-01-17 NOTE — Assessment & Plan Note (Signed)
H/o, pt currently on no meds

## 2015-01-17 NOTE — Assessment & Plan Note (Signed)
SNF - controlled on toprol XL 25 daily, azor, 10/40 and zebeta 5 mg; cont current meds

## 2015-01-17 NOTE — Assessment & Plan Note (Signed)
Chronic; plan cont tylenol as needed

## 2015-01-17 NOTE — Assessment & Plan Note (Signed)
Not stated as uncontrolled; cont lipitor 10 mg daily

## 2015-01-20 DIAGNOSIS — S52531D Colles' fracture of right radius, subsequent encounter for closed fracture with routine healing: Secondary | ICD-10-CM | POA: Diagnosis not present

## 2015-01-28 ENCOUNTER — Encounter: Payer: Self-pay | Admitting: Adult Health

## 2015-01-28 NOTE — Progress Notes (Signed)
Patient ID: Cindy Hayes, female   DOB: 1913-07-16, 82 y.o.   MRN: [id number]    Facility:  [facility]       Allergies  Allergen Reactions  . Penicillins Hives    Has patient had a PCN reaction causing immediate rash, facial/tongue/throat swelling, SOB or lightheadedness with hypotension: No Has patient had a PCN reaction causing severe rash involving mucus membranes or skin necrosis: No (pt had hives and itchy rash)  Has patient had a PCN reaction that required hospitalization: No Has patient had a PCN reaction occurring within the last 10 years: No If all of the above answers are "NO", then may proceed with Cephalosporin use.     Chief Complaint  Patient presents with  . Acute Visit    follow up transfer      HPI:  She has been transferred to facility from home as her family is unable to manage her needs. She is here for short term rehab. At this time; this more than likely represents a long term placement for her. She has a fractured left wrist which she has in a cast. She tells me that she is here for short term rehab. She is not voicing any concerns. There are no nursing complaints at this time.    Past Medical History  Diagnosis Date  . Onychomycosis of toenail   . Hard of hearing   . Memory loss   . Hyperlipidemia   . Hypertension   . Pedal edema   . Hyperkalemia   . Mitral regurgitation   . Diverticulosis    Past Surgical History  Procedure Laterality Date  . Left renal artery stent  1990  . Anal stenosis surgery       Family History  Problem Relation Age of Onset  . Cancer Father   . Cancer Sister      Social History   Social History  . Marital Status: Divorced    Spouse Name: N/A  . Number of Children: N/A  . Years of Education: N/A   Occupational History  . Not on file.   Social History Main Topics  . Smoking status: Former Research scientist (life sciences)  . Smokeless tobacco: Never Used  . Alcohol Use: No  . Drug Use: No  . Sexual Activity: Not on file     Other Topics Concern  . Not on file   Social History Narrative     Filed Vitals:   01/12/15 2119  BP: 128/72  Pulse: 76  Height: 5' (1.524 m)  Weight: 137 lb (62.143 kg)  SpO2: 95%      Patient's Medications  New Prescriptions   No medications on file  Previous Medications   AMLODIPINE-OLMESARTAN (AZOR) 10-40 MG PER TABLET    Take 1 tablet by mouth daily.   ASPIRIN 81 MG TABLET    Take 81 mg by mouth daily.   BISOPROLOL (ZEBETA) 5 MG TABLET    Take 5 mg by mouth daily.   HYDROCODONE-ACETAMINOPHEN (NORCO/VICODIN) 5-325 MG TABLET    Take 0.5 tablets by mouth every 6 (six) hours as needed for moderate pain.   METOPROLOL SUCCINATE (TOPROL-XL) 25 MG 24 HR TABLET    Take 25 mg by mouth daily.   MYRBETRIQ 50 MG TB24 TABLET    Take 50 mg by mouth daily.    SOLIFENACIN (VESICARE) 5 MG TABLET    Take 5 mg by mouth daily.   Modified Medications   No medications on file  Discontinued Medications   No medications  on file     SIGNIFICANT DIAGNOSTIC EXAMS  12-24-14: left wrist x-ray: 1. Displaced distal radius fracture as described above. 2. Probable nondisplaced ulnar styloid fracture.   12-24-14: left forearm x-ray: Fractures of the distal left radius and ulnar styloid.   LABS REVIEWED:   08-26-14: wbc 8.6; hgb 12.5; hct 38.1; mcv 88.1 ;plt 191; glucose 105; bun 14; creat 0.75; k+ 3.9; na++135      Review of Systems  Constitutional: Negative for malaise/fatigue.  HENT:       Is hard of hearing   Respiratory: Negative for cough and shortness of breath.   Cardiovascular: Negative for chest pain and leg swelling.  Gastrointestinal: Negative for heartburn, abdominal pain and constipation.  Musculoskeletal: Negative for myalgias and joint pain.  Skin: Negative.   Neurological: Negative for headaches.  Psychiatric/Behavioral: Negative for depression. The patient is not nervous/anxious.     Physical Exam  Constitutional: No distress.  Eyes: Conjunctivae are normal.   Neck: Neck supple. No JVD present. No thyromegaly present.  Cardiovascular: Normal rate, regular rhythm and intact distal pulses.   Respiratory: Effort normal and breath sounds normal. No respiratory distress. She has no wheezes.  GI: Soft. Bowel sounds are normal. She exhibits no distension. There is no tenderness.  Musculoskeletal: She exhibits no edema.  Able to move all extremities  Has cast left forearm: is able to move fingers; has rapid capillary refill   Lymphadenopathy:    She has no cervical adenopathy.  Neurological: She is alert.  Skin: Skin is warm and dry. She is not diaphoretic.  Psychiatric: She has a normal mood and affect.       ASSESSMENT/ PLAN:  1.  Hypertension: will continue Azor 10/40 mg daily will continue zebeta 5 mg daily; will continue toprol xl 25 mg daily and asa 81 mg daily   2. Dyslipidemia: is presently not on medications; will monitor   3. Hypothyroidism: is presently not on medications; will monitor  4. Urinary incontinence: will continue myrbetriq 50 mg daily; will continue vesicare 5 mg daily   5. Dementia: no significnat change in her status; is presently not on medications; will monitor  6. Left distal radius fracture: will follow up with orthopedics as directed; will continue vicodin 5/325 mg every 6 hours as needed    Time spent with patient  50  minutes >50% time spent counseling; reviewing medical record; tests; labs; and developing future plan of care   Ok Edwards NP [facility]  Contact [phone] Monday through Friday 8am- 5pm  After hours call [phone]

## 2015-02-08 ENCOUNTER — Non-Acute Institutional Stay (SKILLED_NURSING_FACILITY): Payer: Medicare Other | Admitting: Internal Medicine

## 2015-02-08 DIAGNOSIS — F0390 Unspecified dementia without behavioral disturbance: Secondary | ICD-10-CM | POA: Diagnosis not present

## 2015-02-08 DIAGNOSIS — I10 Essential (primary) hypertension: Secondary | ICD-10-CM

## 2015-02-08 DIAGNOSIS — S52531D Colles' fracture of right radius, subsequent encounter for closed fracture with routine healing: Secondary | ICD-10-CM | POA: Diagnosis not present

## 2015-02-08 DIAGNOSIS — M159 Polyosteoarthritis, unspecified: Secondary | ICD-10-CM

## 2015-02-08 DIAGNOSIS — M150 Primary generalized (osteo)arthritis: Secondary | ICD-10-CM

## 2015-02-08 NOTE — Progress Notes (Signed)
MRN: [id number] Name: Cindy Hayes  Sex: female Age: 82 y.o. DOB: [birth year]  [place] #: Karren Burly Facility/Room: Level Of Care: SNF Provider: Inocencio Homes D Emergency Contacts: Extended Emergency Contact Information Primary Emergency Contact: Brown,Kenneth Address: [address] [address] [place] Phone: [phone] Relation: Son Secondary Emergency Contact: Rennis Chris [place] of [place] Phone: [phone] Relation: Other  Code Status:   Allergies: Penicillins  Chief Complaint  Patient presents with  . Medical Management of Chronic Issues    HPI: Patient is 82 y.o. female who recently transferred from another SNF who is being seen for routine issues of dementia, HTN, and OA.  Past Medical History  Diagnosis Date  . Onychomycosis of toenail   . Hard of hearing   . Memory loss   . Hyperlipidemia   . Hypertension   . Pedal edema   . Hyperkalemia   . Mitral regurgitation   . Diverticulosis     Past Surgical History  Procedure Laterality Date  . Left renal artery stent  1990  . Anal stenosis surgery        Medication List       This list is accurate as of: 02/08/15 11:59 PM.  Always use your most recent med list.               aspirin 81 MG tablet  Take 81 mg by mouth daily.     atorvastatin 10 MG tablet  Commonly known as:  LIPITOR  Take 10 mg by mouth daily.     AZOR 10-40 MG tablet  Generic drug:  amLODipine-olmesartan  Take 1 tablet by mouth daily.     bisoprolol 5 MG tablet  Commonly known as:  ZEBETA  Take 5 mg by mouth daily.     clorazepate 3.75 MG tablet  Commonly known as:  TRANXENE  Take 3.75 mg by mouth daily.     HYDROcodone-acetaminophen 5-325 MG tablet  Commonly known as:  NORCO/VICODIN  Take 0.5 tablets by mouth every 6 (six) hours as needed for moderate pain.     metoprolol succinate 25 MG 24 hr tablet  Commonly known as:  TOPROL-XL  Take 25 mg by mouth daily.     MYRBETRIQ 50 MG Tb24 tablet  Generic drug:  mirabegron ER  Take 50 mg by mouth daily.     solifenacin 5 MG tablet  Commonly known as:  VESICARE  Take 5 mg by mouth daily.        No orders of the defined types were placed in this encounter.     There is no immunization history on file for this patient.  Social History  Substance Use Topics  . Smoking status: Former Research scientist (life sciences)  . Smokeless tobacco: Never Used  . Alcohol Use: No    Review of Systems  DATA OBTAINED: from patient, nurse GENERAL:  no fevers, fatigue, appetite changes SKIN: No itching, rash HEENT: No complaint RESPIRATORY: No cough, wheezing, SOB CARDIAC: No chest pain, palpitations, lower extremity edema  GI: No abdominal pain, No N/V/D or constipation, No heartburn or reflux  GU: No dysuria, frequency or urgency, or incontinence  MUSCULOSKELETAL: No unrelieved bone/joint pain NEUROLOGIC: No headache, dizziness  PSYCHIATRIC: No overt anxiety or sadness  Filed Vitals:   02/13/15 1955  BP: 124/70  Pulse: 74  Temp: 97 F (36.1 C)  Resp: 19    Physical Exam  GENERAL APPEARANCE: Alert, No acute distress  SKIN: No diaphoresis rash HEENT: Unremarkable RESPIRATORY: Breathing is even, unlabored. Lung sounds are clear   CARDIOVASCULAR: Heart RRR no murmurs, rubs or gallops. No peripheral edema  GASTROINTESTINAL: Abdomen is soft, non-tender, not distended w/ normal bowel sounds.  GENITOURINARY: Bladder non tender, not distended  MUSCULOSKELETAL: No abnormal joints or musculature NEUROLOGIC: Cranial nerves 2-12 grossly intact. Moves all extremities PSYCHIATRIC: Mood and affect appropriate to situation with dementia, no behavioral issues  Patient Active Problem List   Diagnosis Date Noted  . Dementia without behavioral disturbance 01/14/2015  . Hypothyroidism 01/14/2015  . Stress incontinence 01/14/2015  . OA (osteoarthritis) 01/14/2015  . Distal radius fracture, left 01/14/2015  . Hyperlipidemia   .  Hypertension   . Paronychia of great toe of left foot 12/16/2014  . Ulcer of toe of right foot ([place]) 10/12/2014  . Ingrown nail 02/27/2014  . Onychomycosis 03/18/2013  . Pain in lower limb 03/18/2013  . Acquired hammer toe 03/18/2013  . Corns 04/16/2012  . Nail abnormalities 03/19/2012    CBC    Component Value Date/Time   WBC 6.8 08/26/2014 0952   RBC 4.29 08/26/2014 0952   HGB 13.9 08/26/2014 1102   HCT 41.0 08/26/2014 1102   PLT 191 08/26/2014 0952   MCV 88.8 08/26/2014 0952   LYMPHSABS 2.3 08/26/2014 0952   MONOABS 0.5 08/26/2014 0952   EOSABS 0.1 08/26/2014 0952   BASOSABS 0.0 08/26/2014 0952    CMP     Component Value Date/Time   NA 137 08/26/2014 1102   K 3.9 08/26/2014 1102   CL 99* 08/26/2014 1102   CO2 28 08/26/2014 0952   GLUCOSE 100* 08/26/2014 1102   BUN 14 08/26/2014 1102   CREATININE 0.80 08/26/2014 1102   CALCIUM 9.4 08/26/2014 0952   PROT 5.8* 02/04/2009 0528   ALBUMIN 3.1* 02/04/2009 0528   AST 26 02/04/2009 0528   ALT 18 02/04/2009 0528   ALKPHOS 63 02/04/2009 0528   BILITOT 1.1 02/04/2009 0528   GFRNONAA >60 08/26/2014 0952   GFRAA >60 08/26/2014 0952    Assessment and Plan  Dementia without behavioral disturbance Pt is doing well in her new environment; cont to monitor  Hypertension Well controlled on toprol XL 25 mg, zebeta and azor;plan - cont current regimen  OA (osteoarthritis) Stable on tylenol;plan - cont current regimen    Hennie Duos, MD

## 2015-02-13 ENCOUNTER — Encounter: Payer: Self-pay | Admitting: Internal Medicine

## 2015-02-13 NOTE — Assessment & Plan Note (Signed)
Pt is doing well in her new environment; cont to monitor

## 2015-02-13 NOTE — Assessment & Plan Note (Signed)
Stable on tylenol;plan - cont current regimen

## 2015-02-13 NOTE — Assessment & Plan Note (Signed)
Well controlled on toprol XL 25 mg, zebeta and azor;plan - cont current regimen

## 2015-03-09 ENCOUNTER — Non-Acute Institutional Stay (SKILLED_NURSING_FACILITY): Payer: Medicare Other | Admitting: Adult Health

## 2015-03-09 ENCOUNTER — Encounter: Payer: Self-pay | Admitting: Adult Health

## 2015-03-09 DIAGNOSIS — S52502D Unspecified fracture of the lower end of left radius, subsequent encounter for closed fracture with routine healing: Secondary | ICD-10-CM

## 2015-03-09 DIAGNOSIS — I10 Essential (primary) hypertension: Secondary | ICD-10-CM | POA: Diagnosis not present

## 2015-03-09 DIAGNOSIS — N393 Stress incontinence (female) (male): Secondary | ICD-10-CM

## 2015-03-09 DIAGNOSIS — E038 Other specified hypothyroidism: Secondary | ICD-10-CM

## 2015-03-09 DIAGNOSIS — E785 Hyperlipidemia, unspecified: Secondary | ICD-10-CM | POA: Diagnosis not present

## 2015-03-09 DIAGNOSIS — E034 Atrophy of thyroid (acquired): Secondary | ICD-10-CM | POA: Diagnosis not present

## 2015-03-09 DIAGNOSIS — F0390 Unspecified dementia without behavioral disturbance: Secondary | ICD-10-CM | POA: Diagnosis not present

## 2015-03-09 NOTE — Progress Notes (Signed)
Patient ID: Cindy Hayes, female   DOB: July 27, 1913, 82 y.o.   MRN: [id number]   Facility:  [facility]       Allergies  Allergen Reactions  . Penicillins Hives    Has patient had a PCN reaction causing immediate rash, facial/tongue/throat swelling, SOB or lightheadedness with hypotension: No Has patient had a PCN reaction causing severe rash involving mucus membranes or skin necrosis: No (pt had hives and itchy rash)  Has patient had a PCN reaction that required hospitalization: No Has patient had a PCN reaction occurring within the last 10 years: No If all of the above answers are "NO", then may proceed with Cephalosporin use.     Chief Complaint  Patient presents with  . Medical Management of Chronic Issues    Follow up    HPI:  She is a long term resident of this facility being seen for the management of her chronic illnesses. Overall there is little change in her status. She tells me today that she is feeling good and has no concerns. There are no nursing concerns at this time.    Past Medical History  Diagnosis Date  . Onychomycosis of toenail   . Hard of hearing   . Memory loss   . Hyperlipidemia   . Hypertension   . Pedal edema   . Hyperkalemia   . Mitral regurgitation   . Diverticulosis     Past Surgical History  Procedure Laterality Date  . Left renal artery stent  1990  . Anal stenosis surgery      VITAL SIGNS BP 122/78 mmHg  Pulse 78  Temp(Src) 97 F (36.1 C) (Oral)  Resp 19  Ht 4\' 10"  (1.473 m)  Wt 178 lb 8 oz (80.967 kg)  BMI 37.32 kg/m2  SpO2 95%  Patient's Medications  New Prescriptions   No medications on file  Previous Medications   AMLODIPINE-OLMESARTAN (AZOR) 10-40 MG PER TABLET    Take 1 tablet by mouth daily.   ASPIRIN 81 MG TABLET    Take 81 mg by mouth daily.   HYDROCODONE-ACETAMINOPHEN (NORCO/VICODIN) 5-325 MG TABLET    Take 0.5 tablets by mouth every 6 (six) hours as needed for moderate pain.   METOPROLOL SUCCINATE  (TOPROL-XL) 25 MG 24 HR TABLET    Take 25 mg by mouth daily.   MYRBETRIQ 50 MG TB24 TABLET    Take 50 mg by mouth daily.   Modified Medications   No medications on file  Discontinued Medications     SIGNIFICANT DIAGNOSTIC EXAMS  12-24-14: left wrist x-ray: 1. Displaced distal radius fracture as described above. 2. Probable nondisplaced ulnar styloid fracture.   12-24-14: left forearm x-ray: Fractures of the distal left radius and ulnar styloid.   LABS REVIEWED:   08-26-14: wbc 8.6; hgb 12.5; hct 38.1; mcv 88.1 ;plt 191; glucose 105; bun 14; creat 0.75; k+ 3.9; na++135       Review of Systems  Constitutional: Negative for malaise/fatigue.  HENT:       Is hard of hearing   Respiratory: Negative for cough and shortness of breath.   Cardiovascular: Negative for chest pain and leg swelling.  Gastrointestinal: Negative for heartburn, abdominal pain and constipation.  Musculoskeletal: Negative for myalgias and joint pain.  Skin: Negative.   Neurological: Negative for headaches.  Psychiatric/Behavioral: Negative for depression. The patient is not nervous/anxious.     Physical Exam  Constitutional: No distress.  Eyes: Conjunctivae are normal.  Neck: Neck supple. No JVD present. No  thyromegaly present.  Cardiovascular: Normal rate, regular rhythm and intact distal pulses.   Respiratory: Effort normal and breath sounds normal. No respiratory distress. She has no wheezes.  GI: Soft. Bowel sounds are normal. She exhibits no distension. There is no tenderness.  Musculoskeletal: She exhibits no edema.  Able to move all extremities  Has left wrist splint in place   Lymphadenopathy:    She has no cervical adenopathy.  Neurological: She is alert.  Skin: Skin is warm and dry. She is not diaphoretic.  Psychiatric: She has a normal mood and affect.       ASSESSMENT/ PLAN:  1.  Hypertension: will continue Azor 10/40 mg daily; will continue toprol xl 25 mg daily and asa 81 mg daily    2. Dyslipidemia: is presently not on medications; will monitor   3. Hypothyroidism: is presently not on medications; will monitor  4. Urinary incontinence: will continue myrbetriq 50 mg daily;   5. Dementia: no significnat change in her status; is presently not on medications; will monitor  6. Left distal radius fracture: will follow up with orthopedics as directed; will continue vicodin 5/325 mg every 6 hours as needed  Will check cbc; cmp; tsh            Ok Edwards NP [facility]  Contact [phone] Monday through Friday 8am- 5pm  After hours call [phone]

## 2015-03-10 DIAGNOSIS — I10 Essential (primary) hypertension: Secondary | ICD-10-CM | POA: Diagnosis not present

## 2015-03-10 LAB — CBC AND DIFFERENTIAL
HEMATOCRIT: 34 % — AB (ref 36–46)
HEMOGLOBIN: 11.2 g/dL — AB (ref 12.0–16.0)
Platelets: 246 10*3/uL (ref 150–399)
WBC: 7 10*3/mL

## 2015-03-10 LAB — HEPATIC FUNCTION PANEL
ALT: 12 U/L (ref 7–35)
AST: 17 U/L (ref 13–35)
Alkaline Phosphatase: 106 U/L (ref 25–125)
Bilirubin, Total: 0.3 mg/dL

## 2015-03-10 LAB — BASIC METABOLIC PANEL
BUN: 13 mg/dL (ref 4–21)
Creatinine: 0.7 mg/dL (ref 0.5–1.1)
Glucose: 92 mg/dL
POTASSIUM: 4 mmol/L (ref 3.4–5.3)
SODIUM: 136 mmol/L — AB (ref 137–147)

## 2015-03-10 LAB — TSH: TSH: 3.6 u[IU]/mL (ref 0.41–5.90)

## 2015-03-22 DIAGNOSIS — M25532 Pain in left wrist: Secondary | ICD-10-CM | POA: Diagnosis not present

## 2015-04-13 ENCOUNTER — Non-Acute Institutional Stay (SKILLED_NURSING_FACILITY): Payer: Medicare Other | Admitting: Adult Health

## 2015-04-13 ENCOUNTER — Encounter: Payer: Self-pay | Admitting: Adult Health

## 2015-04-13 DIAGNOSIS — N393 Stress incontinence (female) (male): Secondary | ICD-10-CM | POA: Diagnosis not present

## 2015-04-13 DIAGNOSIS — F0390 Unspecified dementia without behavioral disturbance: Secondary | ICD-10-CM | POA: Diagnosis not present

## 2015-04-13 DIAGNOSIS — E785 Hyperlipidemia, unspecified: Secondary | ICD-10-CM

## 2015-04-13 DIAGNOSIS — E038 Other specified hypothyroidism: Secondary | ICD-10-CM

## 2015-04-13 DIAGNOSIS — S52502D Unspecified fracture of the lower end of left radius, subsequent encounter for closed fracture with routine healing: Secondary | ICD-10-CM | POA: Diagnosis not present

## 2015-04-13 DIAGNOSIS — I10 Essential (primary) hypertension: Secondary | ICD-10-CM | POA: Diagnosis not present

## 2015-04-13 DIAGNOSIS — E034 Atrophy of thyroid (acquired): Secondary | ICD-10-CM

## 2015-04-13 NOTE — Progress Notes (Signed)
Patient ID: Cindy Hayes, female   DOB: Sep 01, 1913, 82 y.o.   MRN: [id number]   Facility:  [facility]       Allergies  Allergen Reactions  . Hctz [Hydrochlorothiazide]   . Penicillins Hives    Has patient had a PCN reaction causing immediate rash, facial/tongue/throat swelling, SOB or lightheadedness with hypotension: No Has patient had a PCN reaction causing severe rash involving mucus membranes or skin necrosis: No (pt had hives and itchy rash)  Has patient had a PCN reaction that required hospitalization: No Has patient had a PCN reaction occurring within the last 10 years: No If all of the above answers are "NO", then may proceed with Cephalosporin use.     Chief Complaint  Patient presents with  . Medical Management of Chronic Issues    Follow up    HPI:  She is a long term resident of this facility being seen for the management of her chronic illnesses. Overall her status is stable; she is ambulating throughout the facility behind her wheelchair. She tells me that she is doing well and staying out of trouble. There are no nursing concerns at this time.    Past Medical History  Diagnosis Date  . Onychomycosis of toenail   . Hard of hearing   . Memory loss   . Hyperlipidemia   . Hypertension   . Pedal edema   . Hyperkalemia   . Mitral regurgitation   . Diverticulosis     Past Surgical History  Procedure Laterality Date  . Left renal artery stent  1990  . Anal stenosis surgery      VITAL SIGNS BP 120/82 mmHg  Pulse 80  Temp(Src) 97 F (36.1 C) (Oral)  Resp 19  Ht 4\' 10"  (1.473 m)  Wt 134 lb 6 oz (60.952 kg)  BMI 28.09 kg/m2  SpO2 95%  Patient's Medications  New Prescriptions   No medications on file  Previous Medications   AMLODIPINE-OLMESARTAN (AZOR) 10-40 MG PER TABLET    Take 1 tablet by mouth daily.   ASPIRIN 81 MG TABLET    Take 81 mg by mouth daily.   HYDROCODONE-ACETAMINOPHEN (NORCO/VICODIN) 5-325 MG TABLET    Take 0.5 tablets by mouth  every 6 (six) hours as needed for moderate pain.   METOPROLOL SUCCINATE (TOPROL-XL) 25 MG 24 HR TABLET    Take 25 mg by mouth daily.   MYRBETRIQ 50 MG TB24 TABLET    Take 50 mg by mouth daily.   Modified Medications   No medications on file  Discontinued Medications   No medications on file     SIGNIFICANT DIAGNOSTIC EXAMS  12-24-14: left wrist x-ray: 1. Displaced distal radius fracture as described above. 2. Probable nondisplaced ulnar styloid fracture.   12-24-14: left forearm x-ray: Fractures of the distal left radius and ulnar styloid.   LABS REVIEWED:   08-26-14: wbc 8.6; hgb 12.5; hct 38.1; mcv 88.1 ;plt 191; glucose 105; bun 14; creat 0.75; k+ 3.9; na++135  03-10-15: wbc 7.0; hgb 11.; hct 33.87; mcv 86.3; plt 246; glucose 82; bun 12.7; creat 0.67; k+ 4.0; na++136; liver normal albumin 3.3; tsh 3.60      Review of Systems  Constitutional: Negative for malaise/fatigue.  HENT:       Is hard of hearing   Respiratory: Negative for cough and shortness of breath.   Cardiovascular: Negative for chest pain and leg swelling.  Gastrointestinal: Negative for heartburn, abdominal pain and constipation.  Musculoskeletal: Negative for myalgias and joint  pain.  Skin: Negative.   Neurological: Negative for headaches.  Psychiatric/Behavioral: Negative for depression. The patient is not nervous/anxious.     Physical Exam  Constitutional: No distress.  Eyes: Conjunctivae are normal.  Neck: Neck supple. No JVD present. No thyromegaly present.  Cardiovascular: Normal rate, regular rhythm and intact distal pulses.   Respiratory: Effort normal and breath sounds normal. No respiratory distress. She has no wheezes.  GI: Soft. Bowel sounds are normal. She exhibits no distension. There is no tenderness.  Musculoskeletal: She exhibits no edema.  Able to move all extremities  Ambulates throughout facility behind wheelchair  Lymphadenopathy:    She has no cervical adenopathy.  Neurological:  She is alert.  Skin: Skin is warm and dry. She is not diaphoretic.  Psychiatric: She has a normal mood and affect.       ASSESSMENT/ PLAN:  1.  Hypertension: will continue Azor 10/40 mg daily; will continue toprol xl 25 mg daily and asa 81 mg daily   2. Dyslipidemia: is presently not on medications; will monitor   3. Hypothyroidism: is presently not on medications; will monitor tsh is 3.60   4. Urinary incontinence: will continue myrbetriq 50 mg daily;   5. Dementia: no significnat change in her status; is presently not on medications; will monitor her weight is 143 pounds 6 ounces   6. Left distal radius fracture: will continue vicodin 5/325 mg every 6 hours as needed     Ok Edwards NP [facility]  Contact [phone] Monday through Friday 8am- 5pm  After hours call [phone]

## 2015-05-05 ENCOUNTER — Encounter: Payer: Self-pay | Admitting: Adult Health

## 2015-05-05 ENCOUNTER — Non-Acute Institutional Stay (SKILLED_NURSING_FACILITY): Payer: Medicare Other | Admitting: Adult Health

## 2015-05-05 DIAGNOSIS — E034 Atrophy of thyroid (acquired): Secondary | ICD-10-CM | POA: Diagnosis not present

## 2015-05-05 DIAGNOSIS — S52502D Unspecified fracture of the lower end of left radius, subsequent encounter for closed fracture with routine healing: Secondary | ICD-10-CM | POA: Diagnosis not present

## 2015-05-05 DIAGNOSIS — E038 Other specified hypothyroidism: Secondary | ICD-10-CM

## 2015-05-05 DIAGNOSIS — N393 Stress incontinence (female) (male): Secondary | ICD-10-CM

## 2015-05-05 DIAGNOSIS — I10 Essential (primary) hypertension: Secondary | ICD-10-CM

## 2015-05-05 NOTE — Progress Notes (Signed)
Patient ID: Cindy Hayes, female   DOB: [birth year], 82 y.o.   MRN: [id number]   Facility:  [facility]     CODE STATUS: DNR  Allergies  Allergen Reactions  . Hctz [Hydrochlorothiazide]   . Penicillins Hives    Has patient had a PCN reaction causing immediate rash, facial/tongue/throat swelling, SOB or lightheadedness with hypotension: No Has patient had a PCN reaction causing severe rash involving mucus membranes or skin necrosis: No (pt had hives and itchy rash)  Has patient had a PCN reaction that required hospitalization: No Has patient had a PCN reaction occurring within the last 10 years: No If all of the above answers are "NO", then may proceed with Cephalosporin use.     Chief Complaint  Patient presents with  . Medical Management of Chronic Issues    Follow up    HPI:   She is a long term resident of this facility being seen for the management of her chronic illnesses. Overall there is little change in her status. She tells me that she is feeling good and has no concerns or ocmlaints today. There are no nursing concerns at this time.    Past Medical History  Diagnosis Date  . Onychomycosis of toenail   . Hard of hearing   . Memory loss   . Hyperlipidemia   . Hypertension   . Pedal edema   . Hyperkalemia   . Mitral regurgitation   . Diverticulosis     Past Surgical History  Procedure Laterality Date  . Left renal artery stent  1990  . Anal stenosis surgery      Social History   Social History  . Marital Status: Divorced    Spouse Name: N/A  . Number of Children: N/A  . Years of Education: N/A   Occupational History  . Not on file.   Social History Main Topics  . Smoking status: Former Research scientist (life sciences)  . Smokeless tobacco: Never Used  . Alcohol Use: No  . Drug Use: No  . Sexual Activity: Not on file   Other Topics Concern  . Not on file   Social History Narrative   Family History  Problem Relation Age of Onset  . Cancer Father   . Cancer  Sister       VITAL SIGNS BP 148/92 mmHg  Pulse 94  Temp(Src) 97 F (36.1 C) (Oral)  Resp 19  Ht 4\' 10"  (1.473 m)  Wt 134 lb 6 oz (60.952 kg)  BMI 28.09 kg/m2  SpO2 95%  Patient's Medications  New Prescriptions   No medications on file  Previous Medications   AMLODIPINE-OLMESARTAN (AZOR) 10-40 MG PER TABLET    Take 1 tablet by mouth daily.   ASPIRIN 81 MG TABLET    Take 81 mg by mouth daily.   HYDROCODONE-ACETAMINOPHEN (NORCO/VICODIN) 5-325 MG TABLET    Take 0.5 tablets by mouth every 6 (six) hours as needed for moderate pain.   METOPROLOL SUCCINATE (TOPROL-XL) 25 MG 24 HR TABLET    Take 25 mg by mouth daily.   MYRBETRIQ 50 MG TB24 TABLET    Take 50 mg by mouth daily.   Modified Medications   No medications on file  Discontinued Medications   No medications on file     SIGNIFICANT DIAGNOSTIC EXAMS  12-24-14: left wrist x-ray: 1. Displaced distal radius fracture as described above. 2. Probable nondisplaced ulnar styloid fracture.   12-24-14: left forearm x-ray: Fractures of the distal left radius and ulnar styloid.  LABS REVIEWED:   08-26-14: wbc 8.6; hgb 12.5; hct 38.1; mcv 88.1 ;plt 191; glucose 105; bun 14; creat 0.75; k+ 3.9; na++135  03-10-15: wbc 7.0; hgb 11.; hct 33.87; mcv 86.3; plt 246; glucose 82; bun 12.7; creat 0.67; k+ 4.0; na++136; liver normal albumin 3.3; tsh 3.60      Review of Systems  Constitutional: Negative for malaise/fatigue.  HENT:       Is hard of hearing   Respiratory: Negative for cough and shortness of breath.   Cardiovascular: Negative for chest pain and leg swelling.  Gastrointestinal: Negative for heartburn, abdominal pain and constipation.  Musculoskeletal: Negative for myalgias and joint pain.  Skin: Negative.   Neurological: Negative for headaches.  Psychiatric/Behavioral: Negative for depression. The patient is not nervous/anxious.     Physical Exam  Constitutional: No distress.  Eyes: Conjunctivae are normal.  Neck: Neck  supple. No JVD present. No thyromegaly present.  Cardiovascular: Normal rate, regular rhythm and intact distal pulses.   Respiratory: Effort normal and breath sounds normal. No respiratory distress. She has no wheezes.  GI: Soft. Bowel sounds are normal. She exhibits no distension. There is no tenderness.  Musculoskeletal: She exhibits no edema.  Able to move all extremities  Ambulates throughout facility behind wheelchair  Lymphadenopathy:    She has no cervical adenopathy.  Neurological: She is alert.  Skin: Skin is warm and dry. She is not diaphoretic.  Psychiatric: She has a normal mood and affect.       ASSESSMENT/ PLAN:  1.  Hypertension: will continue Azor 10/40 mg daily; will continue toprol xl 25 mg daily and asa 81 mg daily   2. Dyslipidemia: is presently not on medications; will monitor   3. Hypothyroidism: is presently not on medications; will monitor tsh is 3.60   4. Urinary incontinence: will continue myrbetriq 50 mg daily;   5. Dementia: no significnat change in her status; is presently not on medications; will monitor her weight is 143 pounds 6 ounces   6. Left distal radius fracture: will continue vicodin 5/325 mg every 6 hours as needed        Ok Edwards NP [facility] Adult [facility]  Contact [phone] Monday through Friday 8am- 5pm  After hours call [phone]

## 2015-06-02 ENCOUNTER — Encounter: Payer: Self-pay | Admitting: Internal Medicine

## 2015-06-02 ENCOUNTER — Non-Acute Institutional Stay (SKILLED_NURSING_FACILITY): Payer: Medicare Other | Admitting: Internal Medicine

## 2015-06-02 DIAGNOSIS — L989 Disorder of the skin and subcutaneous tissue, unspecified: Secondary | ICD-10-CM

## 2015-06-02 NOTE — Progress Notes (Signed)
MRN: [id number] Name: Cindy Hayes  Sex: female Age: 82 y.o. DOB: Sep 27, 1913  [place] #: Karren Burly Facility/Room: Level Of Care: SNF Provider: Noah Delaine. Sheppard Coil, MD Emergency Contacts: Extended Emergency Contact Information Primary Emergency Contact: Esperanza Heir Address: [address]          [place] [address] [place] Phone: [phone] Relation: Son Secondary Emergency Contact: Rennis Chris [place] Phone: [phone] Relation: Other  Code Status: DNR  Allergies: Hctz and Penicillins  Chief Complaint  Patient presents with  . Acute Visit    Acute    HPI: Patient is 82 y.o. female who nursing asked me to see fr a growth noted on her finger for the first time today. Pt says it has been there for a while, can't be more specific. She denies pain or itching.  Past Medical History  Diagnosis Date  . Onychomycosis of toenail   . Hard of hearing   . Memory loss   . Hyperlipidemia   . Hypertension   . Pedal edema   . Hyperkalemia   . Mitral regurgitation   . Diverticulosis   . Dementia without behavioral disturbance 01/14/2015    Past Surgical History  Procedure Laterality Date  . Left renal artery stent  1990  . Anal stenosis surgery        Medication List       This list is accurate as of: 06/02/15 11:59 PM.  Always use your most recent med list.               aspirin 81 MG tablet  Take 81 mg by mouth daily.     AZOR 10-40 MG tablet  Generic drug:  amLODipine-olmesartan  Take 1 tablet by mouth daily.     docusate sodium 100 MG capsule  Commonly known as:  COLACE  Take 100 mg by mouth daily.     HYDROcodone-acetaminophen 5-325 MG tablet  Commonly known as:  NORCO/VICODIN  Take 1 tablet by mouth every 6 (six) hours as needed for moderate pain.     metoprolol succinate 25 MG 24 hr tablet  Commonly known as:  TOPROL-XL  Take 25 mg by mouth daily.     MYRBETRIQ 50 MG Tb24 tablet  Generic drug:   mirabegron ER  Take 50 mg by mouth daily.        Meds ordered this encounter  Medications  . docusate sodium (COLACE) 100 MG capsule    Sig: Take 100 mg by mouth daily.  Marland Kitchen HYDROcodone-acetaminophen (NORCO/VICODIN) 5-325 MG tablet    Sig: Take 1 tablet by mouth every 6 (six) hours as needed for moderate pain.     There is no immunization history on file for this patient.  Social History  Substance Use Topics  . Smoking status: Former Research scientist (life sciences)  . Smokeless tobacco: Never Used  . Alcohol Use: No    Review of Systems  DATA OBTAINED: from patient, nurse GENERAL:  no fevers, fatigue, appetite changes SKIN: No itching, rash; as per HPI HEENT: No complaint RESPIRATORY: No cough, wheezing, SOB CARDIAC: No chest pain, palpitations, lower extremity edema  GI: No abdominal pain, No N/V/D or constipation, No heartburn or reflux  GU: No dysuria, frequency or urgency, or incontinence  MUSCULOSKELETAL: No unrelieved bone/joint pain NEUROLOGIC: No headache, dizziness  PSYCHIATRIC: No overt anxiety or sadness  Filed Vitals:   06/02/15 1207  BP: 118/74  Pulse: 72  Temp: 97 F (36.1 C)  Resp: 19  Physical Exam  GENERAL APPEARANCE: Alert, conversant, No acute distress  SKIN: No diaphoresis rash; finger L long middle phalanx- 75mm wide by approx 52mm high fleshy nodule, no redness or heat, small central excoriation HEENT: Unremarkable RESPIRATORY: Breathing is even, unlabored. Lung sounds are clear   CARDIOVASCULAR: Heart RRR no murmurs, rubs or gallops. No peripheral edema  GASTROINTESTINAL: Abdomen is soft, non-tender, not distended w/ normal bowel sounds.  GENITOURINARY: Bladder non tender, not distended  MUSCULOSKELETAL: No abnormal joints or musculature NEUROLOGIC: Cranial nerves 2-12 grossly intact. Moves all extremities PSYCHIATRIC: Mood and affect appropriate to situation, no behavioral issues  Patient Active Problem List   Diagnosis Date Noted  . Dementia without  behavioral disturbance 01/14/2015  . Hypothyroidism 01/14/2015  . Stress incontinence 01/14/2015  . OA (osteoarthritis) 01/14/2015  . Distal radius fracture, left 01/14/2015  . Hyperlipidemia   . Hypertension     CBC    Component Value Date/Time   WBC 7.0 03/10/2015   WBC 6.8 08/26/2014 0952   RBC 4.29 08/26/2014 0952   HGB 11.2* 03/10/2015   HCT 34* 03/10/2015   PLT 246 03/10/2015   MCV 88.8 08/26/2014 0952   LYMPHSABS 2.3 08/26/2014 0952   MONOABS 0.5 08/26/2014 0952   EOSABS 0.1 08/26/2014 0952   BASOSABS 0.0 08/26/2014 0952    CMP     Component Value Date/Time   NA 136* 03/10/2015   NA 137 08/26/2014 1102   K 4.0 03/10/2015   CL 99* 08/26/2014 1102   CO2 28 08/26/2014 0952   GLUCOSE 100* 08/26/2014 1102   BUN 13 03/10/2015   BUN 14 08/26/2014 1102   CREATININE 0.7 03/10/2015   CREATININE 0.80 08/26/2014 1102   CALCIUM 9.4 08/26/2014 0952   PROT 5.8* 02/04/2009 0528   ALBUMIN 3.1* 02/04/2009 0528   AST 17 03/10/2015   ALT 12 03/10/2015   ALKPHOS 106 03/10/2015   BILITOT 1.1 02/04/2009 0528   GFRNONAA >60 08/26/2014 0952   GFRAA >60 08/26/2014 0952    Assessment and Plan  NODULAR LESION ON SKIN OF FINGER - Have written for consult to dermatology; the problem here is the size of the lesion, if/when/how fast it may grow  No problem-specific assessment & plan notes found for this encounter.   Noah Delaine. Sheppard Coil, MD

## 2015-06-08 DIAGNOSIS — B351 Tinea unguium: Secondary | ICD-10-CM | POA: Diagnosis not present

## 2015-06-08 DIAGNOSIS — M79672 Pain in left foot: Secondary | ICD-10-CM | POA: Diagnosis not present

## 2015-06-08 DIAGNOSIS — M79671 Pain in right foot: Secondary | ICD-10-CM | POA: Diagnosis not present

## 2015-06-09 ENCOUNTER — Non-Acute Institutional Stay (SKILLED_NURSING_FACILITY): Payer: Medicare Other | Admitting: Adult Health

## 2015-06-09 DIAGNOSIS — E785 Hyperlipidemia, unspecified: Secondary | ICD-10-CM | POA: Diagnosis not present

## 2015-06-09 DIAGNOSIS — S52502D Unspecified fracture of the lower end of left radius, subsequent encounter for closed fracture with routine healing: Secondary | ICD-10-CM

## 2015-06-09 DIAGNOSIS — N393 Stress incontinence (female) (male): Secondary | ICD-10-CM | POA: Diagnosis not present

## 2015-06-09 DIAGNOSIS — E038 Other specified hypothyroidism: Secondary | ICD-10-CM | POA: Diagnosis not present

## 2015-06-09 DIAGNOSIS — I10 Essential (primary) hypertension: Secondary | ICD-10-CM

## 2015-06-09 DIAGNOSIS — E034 Atrophy of thyroid (acquired): Secondary | ICD-10-CM

## 2015-06-09 DIAGNOSIS — F0390 Unspecified dementia without behavioral disturbance: Secondary | ICD-10-CM

## 2015-06-09 NOTE — Progress Notes (Signed)
Patient ID: Cindy Hayes, female   DOB: February 14, 1913, 82 y.o.   MRN: [id number]    Location:  [place] Room Number: 110-B Place of Service:  SNF (31)   CODE STATUS: DNR  Allergies  Allergen Reactions  . Hctz [Hydrochlorothiazide]   . Penicillins Hives    Has patient had a PCN reaction causing immediate rash, facial/tongue/throat swelling, SOB or lightheadedness with hypotension: No Has patient had a PCN reaction causing severe rash involving mucus membranes or skin necrosis: No (pt had hives and itchy rash)  Has patient had a PCN reaction that required hospitalization: No Has patient had a PCN reaction occurring within the last 10 years: No If all of the above answers are "NO", then may proceed with Cephalosporin use.     Chief Complaint  Patient presents with  . Medical Management of Chronic Issues    Follow up    HPI:  She is a long term resident of this facility being seen for the management of her chronic illnesses. Overall there is little change in her status. She tells me that she is feeling good. She is out about throughout the facility does ambulate behind her wheelchair daily . There are no nursing concerns at this time.   Past Medical History  Diagnosis Date  . Onychomycosis of toenail   . Hard of hearing   . Memory loss   . Hyperlipidemia   . Hypertension   . Pedal edema   . Hyperkalemia   . Mitral regurgitation   . Diverticulosis   . Dementia without behavioral disturbance 01/14/2015    Past Surgical History  Procedure Laterality Date  . Left renal artery stent  1990  . Anal stenosis surgery      Social History   Social History  . Marital Status: Divorced    Spouse Name: N/A  . Number of Children: N/A  . Years of Education: N/A   Occupational History  . Not on file.   Social History Main Topics  . Smoking status: Former Research scientist (life sciences)  . Smokeless tobacco: Never Used  . Alcohol Use: No  . Drug Use: No  . Sexual  Activity: Not on file   Other Topics Concern  . Not on file   Social History Narrative   Family History  Problem Relation Age of Onset  . Cancer Father   . Cancer Sister       VITAL SIGNS BP 136/72 mmHg  Pulse 82  Temp(Src) 97 F (36.1 C) (Oral)  Resp 19  Ht 4\' 10"  (1.473 m)  Wt 139 lb (63.05 kg)  BMI 29.06 kg/m2  SpO2 95%  Patient's Medications  New Prescriptions   No medications on file  Previous Medications   AMLODIPINE-OLMESARTAN (AZOR) 10-40 MG PER TABLET    Take 1 tablet by mouth daily.   ASPIRIN 81 MG TABLET    Take 81 mg by mouth daily.   DOCUSATE SODIUM (COLACE) 100 MG CAPSULE    Take 100 mg by mouth daily.   HYDROCODONE-ACETAMINOPHEN (NORCO/VICODIN) 5-325 MG TABLET    Take 1 tablet by mouth every 6 (six) hours as needed for moderate pain.   METOPROLOL SUCCINATE (TOPROL-XL) 25 MG 24 HR TABLET    Take 25 mg by mouth daily.   MYRBETRIQ 50 MG TB24 TABLET    Take 50 mg by mouth daily.   Modified Medications   No medications on file  Discontinued Medications   No medications on file  SIGNIFICANT DIAGNOSTIC EXAMS  12-24-14: left wrist x-ray: 1. Displaced distal radius fracture as described above. 2. Probable nondisplaced ulnar styloid fracture.   12-24-14: left forearm x-ray: Fractures of the distal left radius and ulnar styloid.   LABS REVIEWED:   08-26-14: wbc 8.6; hgb 12.5; hct 38.1; mcv 88.1 ;plt 191; glucose 105; bun 14; creat 0.75; k+ 3.9; na++135  03-10-15: wbc 7.0; hgb 11.; hct 33.87; mcv 86.3; plt 246; glucose 82; bun 12.7; creat 0.67; k+ 4.0; na++136; liver normal albumin 3.3; tsh 3.60      Review of Systems  Constitutional: Negative for malaise/fatigue.  HENT:       Is hard of hearing   Respiratory: Negative for cough and shortness of breath.   Cardiovascular: Negative for chest pain and leg swelling.  Gastrointestinal: Negative for heartburn, abdominal pain and constipation.  Musculoskeletal: Negative for myalgias and joint pain.    Skin: Negative.   Neurological: Negative for headaches.  Psychiatric/Behavioral: Negative for depression. The patient is not nervous/anxious.     Physical Exam  Constitutional: No distress.  Eyes: Conjunctivae are normal.  Neck: Neck supple. No JVD present. No thyromegaly present.  Cardiovascular: Normal rate, regular rhythm and intact distal pulses.   Respiratory: Effort normal and breath sounds normal. No respiratory distress. She has no wheezes.  GI: Soft. Bowel sounds are normal. She exhibits no distension. There is no tenderness.  Musculoskeletal: She exhibits no edema.  Able to move all extremities  Ambulates throughout facility behind wheelchair  Lymphadenopathy:    She has no cervical adenopathy.  Neurological: She is alert.  Skin: Skin is warm and dry. She is not diaphoretic.  Psychiatric: She has a normal mood and affect.       ASSESSMENT/ PLAN:  1.  Hypertension: will continue Azor 10/40 mg daily; will continue toprol xl 25 mg daily and asa 81 mg daily   2. Dyslipidemia: is presently not on medications; will monitor   3. Hypothyroidism: is presently not on medications; will monitor tsh is 3.60   4. Urinary incontinence: will continue myrbetriq 50 mg daily;   5. Dementia: no significnat change in her status; is presently not on medications; will monitor her weight is 139 pounds   6. Left distal radius fracture: will continue vicodin 5/325 mg every 6 hours as needed     Ok Edwards NP [facility]  Contact [phone] Monday through Friday 8am- 5pm  After hours call [phone]

## 2015-07-16 ENCOUNTER — Encounter: Payer: Self-pay | Admitting: Adult Health

## 2015-07-16 ENCOUNTER — Non-Acute Institutional Stay (SKILLED_NURSING_FACILITY): Payer: Medicare Other | Admitting: Adult Health

## 2015-07-16 DIAGNOSIS — S52502D Unspecified fracture of the lower end of left radius, subsequent encounter for closed fracture with routine healing: Secondary | ICD-10-CM | POA: Diagnosis not present

## 2015-07-16 DIAGNOSIS — N393 Stress incontinence (female) (male): Secondary | ICD-10-CM | POA: Diagnosis not present

## 2015-07-16 DIAGNOSIS — I10 Essential (primary) hypertension: Secondary | ICD-10-CM

## 2015-07-16 DIAGNOSIS — E785 Hyperlipidemia, unspecified: Secondary | ICD-10-CM | POA: Diagnosis not present

## 2015-07-16 DIAGNOSIS — E038 Other specified hypothyroidism: Secondary | ICD-10-CM | POA: Diagnosis not present

## 2015-07-16 DIAGNOSIS — E034 Atrophy of thyroid (acquired): Secondary | ICD-10-CM

## 2015-07-16 DIAGNOSIS — F0390 Unspecified dementia without behavioral disturbance: Secondary | ICD-10-CM

## 2015-07-16 NOTE — Progress Notes (Signed)
Patient ID: Cindy Hayes, female   DOB: 1913-02-24, 82 y.o.   MRN: [id number]   Location:   [place] Room Number: 110-B Place of Service:  SNF (31)   CODE STATUS: DNR  Allergies  Allergen Reactions  . Hctz [Hydrochlorothiazide]   . Penicillins Hives    Has patient had a PCN reaction causing immediate rash, facial/tongue/throat swelling, SOB or lightheadedness with hypotension: No Has patient had a PCN reaction causing severe rash involving mucus membranes or skin necrosis: No (pt had hives and itchy rash)  Has patient had a PCN reaction that required hospitalization: No Has patient had a PCN reaction occurring within the last 10 years: No If all of the above answers are "NO", then may proceed with Cephalosporin use.     Chief Complaint  Patient presents with  . Medical Management of Chronic Issues    Follow up    HPI:  She is a long term resident of this facility being seen for the management of her chronic illnesses. Overall her status is stable; she does ambulate throughout the facility; will push her wheelchair. She tells me that she is feeling good. There are no nursing concerns at this time.   Past Medical History  Diagnosis Date  . Onychomycosis of toenail   . Hard of hearing   . Memory loss   . Hyperlipidemia   . Hypertension   . Pedal edema   . Hyperkalemia   . Mitral regurgitation   . Diverticulosis   . Dementia without behavioral disturbance 01/14/2015    Past Surgical History  Procedure Laterality Date  . Left renal artery stent  1990  . Anal stenosis surgery      Social History   Social History  . Marital Status: Divorced    Spouse Name: N/A  . Number of Children: N/A  . Years of Education: N/A   Occupational History  . Not on file.   Social History Main Topics  . Smoking status: Former Research scientist (life sciences)  . Smokeless tobacco: Never Used  . Alcohol Use: No  . Drug Use: No  . Sexual Activity: Not on file   Other Topics Concern  .  Not on file   Social History Narrative   Family History  Problem Relation Age of Onset  . Cancer Father   . Cancer Sister       VITAL SIGNS BP 153/80 mmHg  Pulse 81  Temp(Src) 97 F (36.1 C) (Oral)  Resp 19  Ht 4\' 10"  (1.473 m)  Wt 138 lb 2 oz (62.653 kg)  BMI 28.88 kg/m2  SpO2 95%  Patient's Medications  New Prescriptions   No medications on file  Previous Medications   AMLODIPINE-OLMESARTAN (AZOR) 10-40 MG PER TABLET    Take 1 tablet by mouth daily.   ASPIRIN 81 MG TABLET    Take 81 mg by mouth daily.   DOCUSATE SODIUM (COLACE) 100 MG CAPSULE    Take 100 mg by mouth daily.   METOPROLOL SUCCINATE (TOPROL-XL) 25 MG 24 HR TABLET    Take 25 mg by mouth daily.   MYRBETRIQ 50 MG TB24 TABLET    Take 50 mg by mouth daily.   Modified Medications   No medications on file  Discontinued Medications   HYDROCODONE-ACETAMINOPHEN (NORCO/VICODIN) 5-325 MG TABLET    Take 1 tablet by mouth every 6 (six) hours as needed for moderate pain. Reported on 07/16/2015     SIGNIFICANT DIAGNOSTIC EXAMS  12-24-14: left wrist x-ray: 1. Displaced  distal radius fracture as described above. 2. Probable nondisplaced ulnar styloid fracture.   12-24-14: left forearm x-ray: Fractures of the distal left radius and ulnar styloid.   LABS REVIEWED:   08-26-14: wbc 8.6; hgb 12.5; hct 38.1; mcv 88.1 ;plt 191; glucose 105; bun 14; creat 0.75; k+ 3.9; na++135  03-10-15: wbc 7.0; hgb 11.; hct 33.87; mcv 86.3; plt 246; glucose 82; bun 12.7; creat 0.67; k+ 4.0; na++136; liver normal albumin 3.3; tsh 3.60      Review of Systems  Constitutional: Negative for malaise/fatigue.  HENT:       Is hard of hearing   Respiratory: Negative for cough and shortness of breath.   Cardiovascular: Negative for chest pain and leg swelling.  Gastrointestinal: Negative for heartburn, abdominal pain and constipation.  Musculoskeletal: Negative for myalgias and joint pain.  Skin: Negative.   Neurological: Negative for  headaches.  Psychiatric/Behavioral: Negative for depression. The patient is not nervous/anxious.     Physical Exam  Constitutional: No distress.  Eyes: Conjunctivae are normal.  Neck: Neck supple. No JVD present. No thyromegaly present.  Cardiovascular: Normal rate, regular rhythm and intact distal pulses.   Respiratory: Effort normal and breath sounds normal. No respiratory distress. She has no wheezes.  GI: Soft. Bowel sounds are normal. She exhibits no distension. There is no tenderness.  Musculoskeletal: She exhibits no edema.  Able to move all extremities  Ambulates throughout facility behind wheelchair  Lymphadenopathy:    She has no cervical adenopathy.  Neurological: She is alert.  Skin: Skin is warm and dry. She is not diaphoretic.  Psychiatric: She has a normal mood and affect.       ASSESSMENT/ PLAN:  1.  Hypertension: will continue Azor 10/40 mg daily; will continue toprol xl 25 mg daily and asa 81 mg daily   2. Dyslipidemia: is presently not on medications; will monitor   3. Hypothyroidism: is presently not on medications; will monitor tsh is 3.60   4. Urinary incontinence: will continue myrbetriq 50 mg daily;   5. Dementia: no significnat change in her status; is presently not on medications; will monitor her weight is 138 pounds   6. Left distal radius fracture: has resolved; her pain medication has been stopped.     Ok Edwards NP [facility]  Contact [phone] Monday through Friday 8am- 5pm  After hours call [phone]

## 2015-08-05 ENCOUNTER — Encounter: Payer: Self-pay | Admitting: Internal Medicine

## 2015-08-05 ENCOUNTER — Non-Acute Institutional Stay (SKILLED_NURSING_FACILITY): Payer: Medicare Other | Admitting: Internal Medicine

## 2015-08-05 DIAGNOSIS — I10 Essential (primary) hypertension: Secondary | ICD-10-CM

## 2015-08-05 DIAGNOSIS — E785 Hyperlipidemia, unspecified: Secondary | ICD-10-CM

## 2015-08-05 DIAGNOSIS — E038 Other specified hypothyroidism: Secondary | ICD-10-CM

## 2015-08-05 DIAGNOSIS — E034 Atrophy of thyroid (acquired): Secondary | ICD-10-CM | POA: Diagnosis not present

## 2015-08-05 NOTE — Progress Notes (Signed)
MRN: [id number] Name: Cindy Hayes  Sex: female Age: 82 y.o. DOB: [birth year]  [place] #:  Facility/Room: [facility] / 110 B Level Of Care: SNF Provider: Noah Delaine. Sheppard Coil, MD Emergency Contacts: Extended Emergency Contact Information Primary Emergency Contact: Esperanza Heir Address: [address]          [place] [address] [place] Phone: [phone] Relation: Son Secondary Emergency Contact: Rennis Chris [place] Phone: [phone] Relation: Other  Code Status: DNR  Allergies: Hctz [hydrochlorothiazide] and Penicillins  Chief Complaint  Patient presents with  . Medical Management of Chronic Issues    Routine Visit    HPI: Patient is 82 y.o. female who is being seen for routine issues of hypothyroidism, HLD and HTN.  Past Medical History:  Diagnosis Date  . Dementia without behavioral disturbance 01/14/2015  . Diverticulosis   . Hard of hearing   . Hyperkalemia   . Hyperlipidemia   . Hypertension   . Memory loss   . Mitral regurgitation   . Onychomycosis of toenail   . Pedal edema     Past Surgical History:  Procedure Laterality Date  . anal stenosis surgery    . left renal artery stent  1990      Medication List       Accurate as of 08/05/15 12:21 PM. Always use your most recent med list.          aspirin 81 MG tablet Take 81 mg by mouth daily.   AZOR 10-40 MG tablet Generic drug:  amLODipine-olmesartan Take 1 tablet by mouth daily.   docusate sodium 100 MG capsule Commonly known as:  COLACE Take 100 mg by mouth daily.   metoprolol succinate 25 MG 24 hr tablet Commonly known as:  TOPROL-XL Take 25 mg by mouth daily.   MYRBETRIQ 50 MG Tb24 tablet Generic drug:  mirabegron ER Take 50 mg by mouth daily.       No orders of the defined types were placed in this encounter.    There is no immunization history on file for this patient.  Social History  Substance Use Topics  . Smoking  status: Former Research scientist (life sciences)  . Smokeless tobacco: Never Used  . Alcohol use No    Review of Systems  DATA OBTAINED: from patient, nurse GENERAL:  no fevers, fatigue, appetite changes SKIN: No itching, rash HEENT: No complaint RESPIRATORY: No cough, wheezing, SOB CARDIAC: No chest pain, palpitations, lower extremity edema  GI: No abdominal pain, No N/V/D or constipation, No heartburn or reflux  GU: No dysuria, frequency or urgency, or incontinence  MUSCULOSKELETAL: No unrelieved bone/joint pain NEUROLOGIC: No headache, dizziness  PSYCHIATRIC: No overt anxiety or sadness  Vitals:   08/05/15 1215  BP: (!) 147/75  Pulse: 80  Resp: 19  Temp: 97 F (36.1 C)    Physical Exam  GENERAL APPEARANCE: Alert, conversant, No acute distress ; walks around facility pushing her WC; today in activities SKIN: No diaphoresis rash HEENT: Unremarkable RESPIRATORY: Breathing is even, unlabored. Lung sounds are clear   CARDIOVASCULAR: Heart RRR no murmurs, rubs or gallops. No peripheral edema  GASTROINTESTINAL: Abdomen is soft, non-tender, not distended w/ normal bowel sounds.  GENITOURINARY: Bladder non tender, not distended  MUSCULOSKELETAL: No abnormal joints or musculature NEUROLOGIC: Cranial nerves 2-12 grossly intact. Moves all extremities PSYCHIATRIC: Mood and affect appropriate to situation, no behavioral issues  Patient Active Problem List   Diagnosis Date Noted  . Dementia without behavioral disturbance 01/14/2015  . Hypothyroidism  01/14/2015  . Stress incontinence 01/14/2015  . OA (osteoarthritis) 01/14/2015  . Distal radius fracture, left 01/14/2015  . Hyperlipidemia   . Hypertension     CBC    Component Value Date/Time   WBC 7.0 03/10/2015   WBC 6.8 08/26/2014 0952   RBC 4.29 08/26/2014 0952   HGB 11.2 (A) 03/10/2015   HCT 34 (A) 03/10/2015   PLT 246 03/10/2015   MCV 88.8 08/26/2014 0952   LYMPHSABS 2.3 08/26/2014 0952   MONOABS 0.5 08/26/2014 0952   EOSABS 0.1  08/26/2014 0952   BASOSABS 0.0 08/26/2014 0952    CMP     Component Value Date/Time   NA 136 (A) 03/10/2015   K 4.0 03/10/2015   CL 99 (L) 08/26/2014 1102   CO2 28 08/26/2014 0952   GLUCOSE 100 (H) 08/26/2014 1102   BUN 13 03/10/2015   CREATININE 0.7 03/10/2015   CREATININE 0.80 08/26/2014 1102   CALCIUM 9.4 08/26/2014 0952   PROT 5.8 (L) 02/04/2009 0528   ALBUMIN 3.1 (L) 02/04/2009 0528   AST 17 03/10/2015   ALT 12 03/10/2015   ALKPHOS 106 03/10/2015   BILITOT 1.1 02/04/2009 0528   GFRNONAA >60 08/26/2014 0952   GFRAA >60 08/26/2014 0952    Assessment and Plan  Hypertension: will continue Azor 10/40 mg daily; will continue toprol xl 25 mg daily and asa 81 mg daily   Dyslipidemia: is presently not on medications; will monitor    Hypothyroidism: is presently not on medications; will monitor tsh is 3.60     Mery Guadalupe D. Sheppard Coil, MD

## 2015-08-08 ENCOUNTER — Encounter: Payer: Self-pay | Admitting: Internal Medicine

## 2015-08-12 DIAGNOSIS — R69 Illness, unspecified: Secondary | ICD-10-CM | POA: Diagnosis not present

## 2015-09-08 ENCOUNTER — Encounter: Payer: Self-pay | Admitting: Adult Health

## 2015-09-08 ENCOUNTER — Non-Acute Institutional Stay (SKILLED_NURSING_FACILITY): Payer: Medicare Other | Admitting: Adult Health

## 2015-09-08 DIAGNOSIS — M159 Polyosteoarthritis, unspecified: Secondary | ICD-10-CM

## 2015-09-08 DIAGNOSIS — E034 Atrophy of thyroid (acquired): Secondary | ICD-10-CM

## 2015-09-08 DIAGNOSIS — H1033 Unspecified acute conjunctivitis, bilateral: Secondary | ICD-10-CM | POA: Insufficient documentation

## 2015-09-08 DIAGNOSIS — E038 Other specified hypothyroidism: Secondary | ICD-10-CM

## 2015-09-08 DIAGNOSIS — I10 Essential (primary) hypertension: Secondary | ICD-10-CM

## 2015-09-08 DIAGNOSIS — F0390 Unspecified dementia without behavioral disturbance: Secondary | ICD-10-CM

## 2015-09-08 DIAGNOSIS — M150 Primary generalized (osteo)arthritis: Secondary | ICD-10-CM

## 2015-09-08 NOTE — Progress Notes (Signed)
Patient ID: Cindy Hayes, female   DOB: Feb 02, 1913, 82 y.o.   MRN: [id number]   Location:   [place] Room Number: 110-B Place of Service:  SNF (31)   CODE STATUS: DNR  Allergies  Allergen Reactions  . Hctz [Hydrochlorothiazide]   . Penicillins Hives    Chief Complaint  Patient presents with  . Medical Management of Chronic Issues    Follow up    HPI:  She is a long term resident of this facility being seen for the management of her chronic illnesses. She has celebrated her 89th birthday this past month!! She is having left eye pain with redness and drainage; she is wearing an eye patch due to light sensitivity there are no reports of fever present. There are no nursing concerns at this time.    Past Medical History:  Diagnosis Date  . Dementia without behavioral disturbance 01/14/2015  . Diverticulosis   . Hard of hearing   . Hyperkalemia   . Hyperlipidemia   . Hypertension   . Memory loss   . Mitral regurgitation   . Onychomycosis of toenail   . Pedal edema     Past Surgical History:  Procedure Laterality Date  . anal stenosis surgery    . left renal artery stent  1990    Social History   Social History  . Marital status: Divorced    Spouse name: N/A  . Number of children: N/A  . Years of education: N/A   Occupational History  . Not on file.   Social History Main Topics  . Smoking status: Former Research scientist (life sciences)  . Smokeless tobacco: Never Used  . Alcohol use No  . Drug use: No  . Sexual activity: Not on file   Other Topics Concern  . Not on file   Social History Narrative  . No narrative on file   Family History  Problem Relation Age of Onset  . Cancer Father   . Cancer Sister       VITAL SIGNS BP (!) 146/86   Pulse 73   Temp 97 F (36.1 C) (Oral)   Resp 19   Ht 4\' 10"  (1.473 m)   Wt 141 lb (64 kg)   SpO2 95%   BMI 29.47 kg/m   Patient's Medications  New Prescriptions   No medications on file  Previous Medications     AMLODIPINE-OLMESARTAN (AZOR) 10-40 MG PER TABLET    Take 1 tablet by mouth daily.   ASPIRIN 81 MG TABLET    Take 81 mg by mouth daily.   DOCUSATE SODIUM (COLACE) 100 MG CAPSULE    Take 100 mg by mouth daily.   METOPROLOL SUCCINATE (TOPROL-XL) 25 MG 24 HR TABLET    Take 25 mg by mouth daily.   MYRBETRIQ 50 MG TB24 TABLET    Take 50 mg by mouth daily.    POLYVINYL ALCOHOL (LIQUIFILM TEARS) 1.4 % OPHTHALMIC SOLUTION    Place 1 drop into both eyes 3 (three) times daily.  Modified Medications   No medications on file  Discontinued Medications   No medications on file     SIGNIFICANT DIAGNOSTIC EXAMS  12-24-14: left wrist x-ray: 1. Displaced distal radius fracture as described above. 2. Probable nondisplaced ulnar styloid fracture.   12-24-14: left forearm x-ray: Fractures of the distal left radius and ulnar styloid.   LABS REVIEWED:   08-26-14: wbc 8.6; hgb 12.5; hct 38.1; mcv 88.1 ;plt 191; glucose 105; bun 14; creat 0.75; k+ 3.9;  na++135  03-10-15: wbc 7.0; hgb 11.; hct 33.87; mcv 86.3; plt 246; glucose 82; bun 12.7; creat 0.67; k+ 4.0; na++136; liver normal albumin 3.3; tsh 3.60     Review of Systems  Constitutional: Negative for malaise/fatigue.  HENT:       Hard of hearing   Eyes:       Left eye with light sensitivity  Both eyes red with swelling present   Respiratory: Negative for cough and shortness of breath.   Cardiovascular: Negative for chest pain, palpitations and leg swelling.  Gastrointestinal: Negative for abdominal pain, constipation and heartburn.  Musculoskeletal: Negative for back pain, joint pain and myalgias.  Skin: Negative.   Neurological: Negative for dizziness.  Psychiatric/Behavioral: The patient is not nervous/anxious.       Physical Exam  Constitutional: No distress.  Eyes:  Bilateral conjunctiva red with periorbital edema present.    Neck: Neck supple. No JVD present. No thyromegaly present.  Cardiovascular: Normal rate, regular rhythm and  intact distal pulses.   Respiratory: Effort normal and breath sounds normal. No respiratory distress. She has no wheezes.  GI: Soft. Bowel sounds are normal. She exhibits no distension. There is no tenderness.  Musculoskeletal: She exhibits no edema.  Able to move all extremities  Ambulates throughout facility behind wheelchair  Lymphadenopathy:    She has no cervical adenopathy.  Neurological: She is alert.  Skin: Skin is warm and dry. She is not diaphoretic.  Psychiatric: She has a normal mood and affect.       ASSESSMENT/ PLAN:  1.  Hypertension: will continue Azor 10/40 mg daily; will continue toprol xl 25 mg daily and asa 81 mg daily   2. Dyslipidemia: is presently not on medications; will monitor   3. Hypothyroidism: is presently not on medications; will monitor tsh is 3.60   4. Urinary incontinence: will continue myrbetriq 50 mg daily;   5. Dementia: no significnat change in her status; is presently not on medications; will monitor her weight is stable at 141 pounds   6. Bilateral conjunctivitis  Will begin Bleph -10   2 drops both eyes four times daily for 10 days and will monitor    Will check cbc cmp tsh    Ok Edwards NP [facility]  Contact [phone] Monday through Friday 8am- 5pm  After hours call [phone]

## 2015-09-09 DIAGNOSIS — H2512 Age-related nuclear cataract, left eye: Secondary | ICD-10-CM | POA: Diagnosis not present

## 2015-09-09 DIAGNOSIS — H401124 Primary open-angle glaucoma, left eye, indeterminate stage: Secondary | ICD-10-CM | POA: Diagnosis not present

## 2015-09-09 DIAGNOSIS — Z961 Presence of intraocular lens: Secondary | ICD-10-CM | POA: Diagnosis not present

## 2015-09-09 DIAGNOSIS — E785 Hyperlipidemia, unspecified: Secondary | ICD-10-CM | POA: Diagnosis not present

## 2015-09-09 DIAGNOSIS — D3131 Benign neoplasm of right choroid: Secondary | ICD-10-CM | POA: Diagnosis not present

## 2015-09-09 LAB — HEPATIC FUNCTION PANEL
ALT: 10 U/L (ref 7–35)
AST: 19 U/L (ref 13–35)
Alkaline Phosphatase: 132 U/L — AB (ref 25–125)
BILIRUBIN, TOTAL: 0.4 mg/dL

## 2015-09-09 LAB — BASIC METABOLIC PANEL
BUN: 15 mg/dL (ref 4–21)
CREATININE: 0.9 mg/dL (ref 0.5–1.1)
GLUCOSE: 114 mg/dL
Potassium: 4.2 mmol/L (ref 3.4–5.3)
Sodium: 132 mmol/L — AB (ref 137–147)

## 2015-09-09 LAB — TSH: TSH: 4.64 u[IU]/mL (ref 0.41–5.90)

## 2015-09-23 ENCOUNTER — Non-Acute Institutional Stay (SKILLED_NURSING_FACILITY): Payer: Medicare Other | Admitting: Internal Medicine

## 2015-09-23 DIAGNOSIS — H109 Unspecified conjunctivitis: Secondary | ICD-10-CM | POA: Diagnosis not present

## 2015-09-27 NOTE — Progress Notes (Signed)
This is an acute visit.  Level care skilled.  [place]  Chief complaint-acute visit follow-up and antibiotics.  History of present illness.  Patient is a very pleasant 82 year old female who has a history of conjunctivitis more so her left eye-she has received Bleph 10 but according in nursing has had somewhat minimal improvement.  She continues to have some left eye sensitivity.  She denies any loss of vision however there have been no fever chills she actually is ambulating about the facility at her baseline continues to be pleasant conversant quite spry.  Past Medical History:  Diagnosis Date  . Dementia without behavioral disturbance 01/14/2015  . Diverticulosis   . Hard of hearing   . Hyperkalemia   . Hyperlipidemia   . Hypertension   . Memory loss   . Mitral regurgitation   . Onychomycosis of toenail   . Pedal edema          Past Surgical History:  Procedure Laterality Date  . anal stenosis surgery    . left renal artery stent  1990    Social History        Social History  . Marital status: Divorced    Spouse name: N/A  . Number of children: N/A  . Years of education: N/A      Occupational History  . Not on file.       Social History Main Topics  . Smoking status: Former Research scientist (life sciences)  . Smokeless tobacco: Never Used  . Alcohol use No  . Drug use: No  . Sexual activity: Not on file       Other Topics Concern  . Not on file      Social History Narrative  . No narrative on file        Family History  Problem Relation Age of Onset  . Cancer Father   . Cancer Sister              Patient's Medications  New Prescriptions   No medications on file  Previous Medications   AMLODIPINE-OLMESARTAN (AZOR) 10-40 MG PER TABLET    Take 1 tablet by mouth daily.   ASPIRIN 81 MG TABLET    Take 81 mg by mouth daily.   DOCUSATE SODIUM (COLACE) 100 MG CAPSULE    Take 100 mg by mouth daily.    METOPROLOL SUCCINATE (TOPROL-XL) 25 MG 24 HR TABLET    Take 25 mg by mouth daily.   MYRBETRIQ 50 MG TB24 TABLET    Take 50 mg by mouth daily.    POLYVINYL ALCOHOL (LIQUIFILM TEARS) 1.4 % OPHTHALMIC SOLUTION    Place 1 drop into both eyes 3 (three) times daily.  Modified Medications   No medications on file  Discontinued Medications   No medications on file     SIGNIFICANT DIAGNOSTIC EXAMS  12-24-14: left wrist x-ray: 1. Displaced distal radius fracture as described above. 2. Probable nondisplaced ulnar styloid fracture.  12-24-14: left forearm x-ray: Fractures of the distal left radius and ulnar styloid.   LABS REVIEWED:   08-26-14: wbc 8.6; hgb 12.5; hct 38.1; mcv 88.1 ;plt 191; glucose 105; bun 14; creat 0.75; k+ 3.9; na++135  03-10-15: wbc 7.0; hgb 11.; hct 33.87; mcv 86.3; plt 246; glucose 82; bun 12.7; creat 0.67; k+ 4.0; na++136; liver normal albumin 3.3; tsh 3.60     Review of Systems  Constitutional: Negative for malaise/fatigue.  HENT:       Hard of hearing  Eyes:       Left eye with light sensitivity   Respiratory: Negative for cough and shortness of breath.   Cardiovascular: Negative for chest pain, palpitations and leg swelling.  Gastrointestinal: Negative for abdominal pain, constipation and heartburn.  Musculoskeletal: Negative for back pain, joint pain and myalgias.   .   Neurological: Negative for dizziness.  Psychiatric/Behavioral: The patient is not nervous/anxious.     She is afebrile respirations of 16 Physical Exam  Constitutional: No distress.  Eyes:  left eye conjunctiva red with  minimal periorbital edema present. Right conjunctiva appears clear     .  Musculoskeletal: She exhibits no edema.  Able to move all extremities  Ambulates throughout facility behind wheelchair     Neurological: She is alert.  Skin: Skin is warm and dry. She is not diaphoretic.  Psychiatric: She has a normal mood and affect.    Assessment  and plan.  #1 left eye conjunctivitis-at this point will switch antibiotic to Cipro ophthalmologic solution 0.3 percent  2 drops 3 times a day 7 days and monitor-conjunctivitis does not appear to be any worse  but appears to have room for improvement on the left eye again will continue to monitor she is not in any distress With no acute visual changes.  [id number]

## 2015-10-04 ENCOUNTER — Non-Acute Institutional Stay (SKILLED_NURSING_FACILITY): Payer: Medicare Other | Admitting: Internal Medicine

## 2015-10-04 ENCOUNTER — Encounter: Payer: Self-pay | Admitting: Internal Medicine

## 2015-10-04 DIAGNOSIS — F0390 Unspecified dementia without behavioral disturbance: Secondary | ICD-10-CM | POA: Diagnosis not present

## 2015-10-04 DIAGNOSIS — H1089 Other conjunctivitis: Secondary | ICD-10-CM

## 2015-10-04 DIAGNOSIS — H409 Unspecified glaucoma: Secondary | ICD-10-CM | POA: Diagnosis not present

## 2015-10-04 NOTE — Progress Notes (Signed)
Patient ID: Cindy Hayes, female   DOB: December 16, 1913, 82 y.o.   MRN: [id number]    DATE:  10/04/2015  Location:    [place] Room Number: 110 B Place of Service: SNF (31)   Extended Emergency Contact Information Primary Emergency Contact: Esperanza Heir Address: [place] [address] [place] Phone: [phone] Relation: Son Secondary Emergency Contact: Rennis Chris [place] of [place] Phone: [phone] Relation: Other  Advanced Directive information Does patient have an advance directive?: Yes, Type of Advance Directive: Out of facility DNR (pink MOST or yellow form), Does patient want to make changes to advanced directive?: No - Patient declined  Chief Complaint  Patient presents with  . Acute Visit    eye discharge    HPI:  82 yo female long term resident seen today for eye d/c. She was tx 9/6th (bleph-10 x 10 days into OU) and 9/21st (cipro gtts 3 gtts TID into OS x 7 days) for conjunctivitis . Each time eye improved but upon completion of tx, redness, itching and eye d/c returned. She has glaucoma and is on xalatan eye gtts. She has not seen eye specialist in several yrs. She reports decreased vision that began after initial eye infection. No pain or burning. No f/c. She has increased tearing. No dizziness. She is a poor historian due to dementia. Hx obtained from chart.  Past Medical History:  Diagnosis Date  . Dementia without behavioral disturbance 01/14/2015  . Diverticulosis   . Hard of hearing   . Hyperkalemia   . Hyperlipidemia   . Hypertension   . Memory loss   . Mitral regurgitation   . Onychomycosis of toenail   . Pedal edema     Past Surgical History:  Procedure Laterality Date  . anal stenosis surgery    . left renal artery stent  1990    Patient Care Team: Gildardo Cranker, DO as PCP - General (Internal Medicine)  Social History   Social History  . Marital status: Divorced      Spouse name: N/A  . Number of children: N/A  . Years of education: N/A   Occupational History  . Not on file.   Social History Main Topics  . Smoking status: Former Research scientist (life sciences)  . Smokeless tobacco: Never Used  . Alcohol use No  . Drug use: No  . Sexual activity: Not on file   Other Topics Concern  . Not on file   Social History Narrative  . No narrative on file     reports that she has quit smoking. She has never used smokeless tobacco. She reports that she does not drink alcohol or use drugs.  Family History  Problem Relation Age of Onset  . Cancer Father   . Cancer Sister    Family Status  Relation Status  . Father   . Sister     Immunization History  Administered Date(s) Administered  . PPD Test 09/10/2015, 09/17/2015    Allergies  Allergen Reactions  . Hctz [Hydrochlorothiazide]   . Penicillins Hives    Has patient had a PCN reaction causing immediate rash, facial/tongue/throat swelling, SOB or lightheadedness with hypotension: No Has patient had a PCN reaction causing severe rash involving mucus membranes or skin necrosis: No (pt had hives and itchy rash)  Has patient had a PCN reaction that required hospitalization: No Has patient had a PCN reaction occurring within the last 10 years: No  If all of the above answers are "NO", then may proceed with Cephalosporin use.     Medications: Patient's Medications  New Prescriptions   No medications on file  Previous Medications   AMLODIPINE-OLMESARTAN (AZOR) 10-40 MG PER TABLET    Take 1 tablet by mouth daily.   ASPIRIN 81 MG TABLET    Take 81 mg by mouth daily.   DOCUSATE SODIUM (COLACE) 100 MG CAPSULE    Take 100 mg by mouth daily.   LATANOPROST (XALATAN) 0.005 % OPHTHALMIC SOLUTION    Place 1 drop into both eyes at bedtime.   MAGNESIUM HYDROXIDE (MILK OF MAGNESIA) 400 MG/5ML SUSPENSION    Take 30 mLs by mouth daily as needed for mild constipation.   METOPROLOL SUCCINATE (TOPROL-XL) 25 MG 24 HR TABLET     Take 25 mg by mouth daily.   MYRBETRIQ 50 MG TB24 TABLET    Take 50 mg by mouth daily.   Modified Medications   No medications on file  Discontinued Medications   POLYVINYL ALCOHOL (LIQUIFILM TEARS) 1.4 % OPHTHALMIC SOLUTION    Place 1 drop into both eyes 3 (three) times daily.    Review of Systems  Unable to perform ROS: Dementia    Vitals:   10/04/15 1049  BP: (!) 148/70  Pulse: 79  Temp: 97.7 F (36.5 C)  TempSrc: Oral  Weight: 143 lb 9.6 oz (65.1 kg)  Height: 4\' 10"  (1.473 m)   Body mass index is 30.01 kg/m.  Physical Exam  Constitutional: She appears well-developed.  Frail appearing in NAD, sitting in w/c  Eyes: EOM are normal. Pupils are equal, round, and reactive to light. Right eye exhibits no discharge. Left eye exhibits discharge (clear). Left eye exhibits no chemosis, no exudate and no hordeolum. No foreign body present in the left eye. Left conjunctiva is injected. Left conjunctiva has no hemorrhage. No scleral icterus.    Musculoskeletal: She exhibits edema.  Neurological: She is alert.  Skin: Skin is warm and dry. No rash noted.  Psychiatric: She has a normal mood and affect. Her behavior is normal.     Labs reviewed: No visits with results within 3 Month(s) from this visit.  Latest known visit with results is:  Nursing Home on 04/13/2015  Component Date Value Ref Range Status  . Hemoglobin 03/10/2015 11.2* 12.0 - 16.0 g/dL Final  . HCT 03/10/2015 34* 36 - 46 % Final  . Platelets 03/10/2015 246  150 - 399 K/L Final  . WBC 03/10/2015 7.0  10^3/mL Final  . Glucose 03/10/2015 92  mg/dL Final  . BUN 03/10/2015 13  4 - 21 mg/dL Final  . Creatinine 03/10/2015 0.7  0.5 - 1.1 mg/dL Final  . Potassium 03/10/2015 4.0  3.4 - 5.3 mmol/L Final  . Sodium 03/10/2015 136* 137 - 147 mmol/L Final  . Alkaline Phosphatase 03/10/2015 106  25 - 125 U/L Final  . ALT 03/10/2015 12  7 - 35 U/L Final  . AST 03/10/2015 17  13 - 35 U/L Final  . Bilirubin, Total 03/10/2015  0.3  mg/dL Final  . TSH 03/10/2015 3.60  0.41 - 5.90 uIU/mL Final    No results found.   Assessment/Plan   ICD-9-CM ICD-10-CM   1. Other conjunctivitis of left eye 372.39 H10.89   2. Glaucoma of both eyes, unspecified glaucoma type 365.9 H40.9   3. Dementia without behavioral disturbance, unspecified dementia type 294.20 F03.90    Start trimethoprim polymyxin ophthalmic gtts. Instill 1 gtt OS QID x  7 days  ASAP referral ophthamology as this is 3rd tx for conjunctivitis in the last month and she has noticed change in vision  Hand hygiene imperative  Cont other meds as ordered  Will follow   Seraj Dunnam S. Perlie Gold  [facility] [address] [place], [place] [address] [phone] Cell (Monday-Friday 8 AM - 5 PM) [phone] After 5 PM and follow prompts

## 2015-10-07 ENCOUNTER — Non-Acute Institutional Stay (SKILLED_NURSING_FACILITY): Payer: Medicare Other | Admitting: Internal Medicine

## 2015-10-07 ENCOUNTER — Encounter: Payer: Self-pay | Admitting: Internal Medicine

## 2015-10-07 DIAGNOSIS — M159 Polyosteoarthritis, unspecified: Secondary | ICD-10-CM

## 2015-10-07 DIAGNOSIS — F0390 Unspecified dementia without behavioral disturbance: Secondary | ICD-10-CM | POA: Diagnosis not present

## 2015-10-07 DIAGNOSIS — E034 Atrophy of thyroid (acquired): Secondary | ICD-10-CM | POA: Diagnosis not present

## 2015-10-07 DIAGNOSIS — R600 Localized edema: Secondary | ICD-10-CM

## 2015-10-07 DIAGNOSIS — I10 Essential (primary) hypertension: Secondary | ICD-10-CM | POA: Diagnosis not present

## 2015-10-07 DIAGNOSIS — M150 Primary generalized (osteo)arthritis: Secondary | ICD-10-CM | POA: Diagnosis not present

## 2015-10-07 DIAGNOSIS — H409 Unspecified glaucoma: Secondary | ICD-10-CM | POA: Diagnosis not present

## 2015-10-07 DIAGNOSIS — H547 Unspecified visual loss: Secondary | ICD-10-CM

## 2015-10-07 NOTE — Progress Notes (Signed)
Patient ID: Cindy Hayes, female   DOB: 08-16-1913, 82 y.o.   MRN: [id number]    DATE: 10/07/2015  Location:    [facility] Room Number: 110 B Place of Service: SNF (31)   Extended Emergency Contact Information Primary Emergency Contact: Esperanza Heir Address: [place] [address] [place] Phone: [phone] Relation: Son Secondary Emergency Contact: Rennis Chris [place] of [place] Phone: [phone] Relation: Other  Advanced Directive information Does patient have an advance directive?: Yes, Type of Advance Directive: Out of facility DNR (pink MOST or yellow form), Does patient want to make changes to advanced directive?: No - Patient declined  Chief Complaint  Patient presents with  . Medical Management of Chronic Issues    Routine Visit    HPI:  82 yo female long term resident seen today for f/u. She is c/a reduced OS vision with increased tearing.She was tx 9/6th (bleph-10 x 10 days into OU) and 9/21st (cipro gtts 3 gtts TID into OS x 7 days) for conjunctivitis . Each time eye improved but upon completion of tx, redness, itching and eye d/c returned. She has glaucoma and is on xalatan eye gtts. She has not seen eye specialist yet. Appetite ok. Sleeping well. No nursing issues. No f/c, CP, SOB. She is a poor historian due to dementia. Hx obtained from chart.  Hypertension - BP stable on Azor 10/40 mg daily; toprol xl 25 mg daily; takes ASA  81 mg daily   Hyperlipidemia - diet controlled   Hypothyroidism- stable without medication. TSH  3.60   Urinary incontinence - stable on myrbetriq 50 mg daily;   Dementia - stable without medication; weight stable at 143 lbs   Past Medical History:  Diagnosis Date  . Dementia without behavioral disturbance 01/14/2015  . Diverticulosis   . Hard of hearing   . Hyperkalemia   . Hyperlipidemia   . Hypertension   . Memory loss   . Mitral regurgitation   .  Onychomycosis of toenail   . Pedal edema     Past Surgical History:  Procedure Laterality Date  . anal stenosis surgery    . left renal artery stent  1990    Patient Care Team: Gildardo Cranker, DO as PCP - General (Internal Medicine)  Social History   Social History  . Marital status: Divorced    Spouse name: N/A  . Number of children: N/A  . Years of education: N/A   Occupational History  . Not on file.   Social History Main Topics  . Smoking status: Former Research scientist (life sciences)  . Smokeless tobacco: Never Used  . Alcohol use No  . Drug use: No  . Sexual activity: Not on file   Other Topics Concern  . Not on file   Social History Narrative  . No narrative on file     reports that she has quit smoking. She has never used smokeless tobacco. She reports that she does not drink alcohol or use drugs.  Family History  Problem Relation Age of Onset  . Cancer Father   . Cancer Sister    Family Status  Relation Status  . Father   . Sister     Immunization History  Administered Date(s) Administered  . PPD Test 09/10/2015, 09/17/2015    Allergies  Allergen Reactions  . Hctz [Hydrochlorothiazide]   . Penicillins Hives    Has patient had a PCN reaction causing immediate  rash, facial/tongue/throat swelling, SOB or lightheadedness with hypotension: No Has patient had a PCN reaction causing severe rash involving mucus membranes or skin necrosis: No (pt had hives and itchy rash)  Has patient had a PCN reaction that required hospitalization: No Has patient had a PCN reaction occurring within the last 10 years: No If all of the above answers are "NO", then may proceed with Cephalosporin use.     Medications: Patient's Medications  New Prescriptions   No medications on file  Previous Medications   AMLODIPINE-OLMESARTAN (AZOR) 10-40 MG PER TABLET    Take 1 tablet by mouth daily.   ASPIRIN 81 MG TABLET    Take 81 mg by mouth daily.   DOCUSATE SODIUM (COLACE) 100 MG CAPSULE     Take 100 mg by mouth daily.   LATANOPROST (XALATAN) 0.005 % OPHTHALMIC SOLUTION    Place 1 drop into both eyes at bedtime.   MAGNESIUM HYDROXIDE (MILK OF MAGNESIA) 400 MG/5ML SUSPENSION    Take 30 mLs by mouth daily as needed for mild constipation.   METOPROLOL SUCCINATE (TOPROL-XL) 25 MG 24 HR TABLET    Take 25 mg by mouth daily.   MYRBETRIQ 50 MG TB24 TABLET    Take 50 mg by mouth daily.    TRIMETHOPRIM-POLYMYXIN B (POLYTRIM) OPHTHALMIC SOLUTION    Place 1 drop into the left eye daily.  Modified Medications   No medications on file  Discontinued Medications   No medications on file    Review of Systems  Unable to perform ROS: Dementia    Vitals:   10/07/15 1418  BP: 140/76  Pulse: 72  Temp: 97.7 F (36.5 C)  TempSrc: Oral  Weight: 143 lb 9.6 oz (65.1 kg)  Height: 4\' 10"  (1.473 m)   Body mass index is 30.01 kg/m.  Physical Exam  Constitutional: She appears well-developed and well-nourished.  Frail appearing sitting on edge of bed in NAD  HENT:  Mouth/Throat: Oropharynx is clear and moist. No oropharyngeal exudate.  MMM; no oral thrush  Eyes: Pupils are equal, round, and reactive to light. No scleral icterus.  OS red with clear d/c but no orbital TTP; no change in OS d/c; OD clear  Neck: Neck supple. Carotid bruit is not present. No tracheal deviation present. No thyromegaly present.  Cardiovascular: Normal rate, regular rhythm and intact distal pulses.  Exam reveals no gallop and no friction rub.   Murmur (1/6 SEM) heard. +2 pitting LE edema b/l; no calf TTP; TED stockings intact b/l  Pulmonary/Chest: Effort normal and breath sounds normal. No stridor. No respiratory distress. She has no wheezes. She has no rales.  Abdominal: Soft. Bowel sounds are normal. She exhibits no distension and no mass. There is no hepatomegaly. There is no tenderness. There is no rebound and no guarding.  Musculoskeletal: She exhibits edema.  Lymphadenopathy:    She has no cervical adenopathy.    Neurological: She is alert.  Skin: Skin is warm and dry. No rash noted.  Psychiatric: She has a normal mood and affect. Her behavior is normal. Thought content normal.     Labs reviewed: Nursing Home on 10/07/2015  Component Date Value Ref Range Status  . Glucose 09/09/2015 114  mg/dL Final  . BUN 09/09/2015 15  4 - 21 mg/dL Final  . Creatinine 09/09/2015 0.9  0.5 - 1.1 mg/dL Final  . Potassium 09/09/2015 4.2  3.4 - 5.3 mmol/L Final  . Sodium 09/09/2015 132* 137 - 147 mmol/L Final  . Alkaline Phosphatase  09/09/2015 132* 25 - 125 U/L Final  . ALT 09/09/2015 10  7 - 35 U/L Final  . AST 09/09/2015 19  13 - 35 U/L Final  . Bilirubin, Total 09/09/2015 0.4  mg/dL Final  . TSH 09/09/2015 4.64  0.41 - 5.90 uIU/mL Final    No results found.   Assessment/Plan   ICD-9-CM ICD-10-CM   1. Reduced vision 369.9 H54.7    OS  2. Glaucoma of both eyes, unspecified glaucoma type 365.9 H40.9   3. Dementia without behavioral disturbance, unspecified dementia type 294.20 F03.90   4. Essential hypertension 401.9 I10   5. Primary osteoarthritis involving multiple joints 715.09 M15.0   6. Hypothyroidism due to acquired atrophy of thyroid 244.8 E03.4    246.8    7. Edema leg 782.3 R60.0    b/l    F/u with ophthamology as scheduled. No antibacterial eye gtts recommended at this time as she has been tx already  Cont current meds as ordered  Nutritional supplements as indicated  PT/OT/ST as indicated  Will follow  Hosteen Kienast S. Perlie Gold  [facility] [address] [place], [place] [address] [phone] Cell (Monday-Friday 8 AM - 5 PM) [phone] After 5 PM and follow prompts

## 2015-10-08 DIAGNOSIS — I70203 Unspecified atherosclerosis of native arteries of extremities, bilateral legs: Secondary | ICD-10-CM | POA: Diagnosis not present

## 2015-10-08 DIAGNOSIS — B351 Tinea unguium: Secondary | ICD-10-CM | POA: Diagnosis not present

## 2015-10-08 DIAGNOSIS — M79674 Pain in right toe(s): Secondary | ICD-10-CM | POA: Diagnosis not present

## 2015-10-08 DIAGNOSIS — M79675 Pain in left toe(s): Secondary | ICD-10-CM | POA: Diagnosis not present

## 2015-11-09 ENCOUNTER — Non-Acute Institutional Stay (SKILLED_NURSING_FACILITY): Payer: Medicare Other | Admitting: Adult Health

## 2015-11-09 ENCOUNTER — Encounter: Payer: Self-pay | Admitting: Adult Health

## 2015-11-09 DIAGNOSIS — E034 Atrophy of thyroid (acquired): Secondary | ICD-10-CM | POA: Diagnosis not present

## 2015-11-09 DIAGNOSIS — H1033 Unspecified acute conjunctivitis, bilateral: Secondary | ICD-10-CM

## 2015-11-09 DIAGNOSIS — F0390 Unspecified dementia without behavioral disturbance: Secondary | ICD-10-CM | POA: Diagnosis not present

## 2015-11-09 DIAGNOSIS — I10 Essential (primary) hypertension: Secondary | ICD-10-CM

## 2015-11-09 DIAGNOSIS — N393 Stress incontinence (female) (male): Secondary | ICD-10-CM | POA: Diagnosis not present

## 2015-11-09 NOTE — Progress Notes (Signed)
Patient ID: Cindy Hayes, female   DOB: July 02, 1913, 82 y.o.   MRN: [id number]   Location:   [place] Room Number: 110-B Place of Service:  SNF (31)   CODE STATUS: DNR  Allergies  Allergen Reactions  . Hctz [Hydrochlorothiazide]   . Penicillins Hives    Has patient had a PCN reaction causing immediate rash, facial/tongue/throat swelling, SOB or lightheadedness with hypotension: No Has patient had a PCN reaction causing severe rash involving mucus membranes or skin necrosis: No (pt had hives and itchy rash)  Has patient had a PCN reaction that required hospitalization: No Has patient had a PCN reaction occurring within the last 10 years: No If all of the above answers are "NO", then may proceed with Cephalosporin use.     Chief Complaint  Patient presents with  . Medical Management of Chronic Issues    Follow up    HPI:  She is a long term resident of this facility being seen for the management of her chronic illnesses. Overall her status is without significant change. She does continue to ambulate with her wheelchair. She is not voicing any complaints. There are no nursing concerns at this time.    Past Medical History:  Diagnosis Date  . Dementia without behavioral disturbance 01/14/2015  . Diverticulosis   . Hard of hearing   . Hyperkalemia   . Hyperlipidemia   . Hypertension   . Memory loss   . Mitral regurgitation   . Onychomycosis of toenail   . Pedal edema     Past Surgical History:  Procedure Laterality Date  . anal stenosis surgery    . left renal artery stent  1990    Social History   Social History  . Marital status: Divorced    Spouse name: N/A  . Number of children: N/A  . Years of education: N/A   Occupational History  . Not on file.   Social History Main Topics  . Smoking status: Former Research scientist (life sciences)  . Smokeless tobacco: Never Used  . Alcohol use No  . Drug use: No  . Sexual activity: Not on file   Other Topics Concern  .  Not on file   Social History Narrative  . No narrative on file   Family History  Problem Relation Age of Onset  . Cancer Father   . Cancer Sister       VITAL SIGNS BP (!) 142/84   Pulse 78   Temp 97.7 F (36.5 C) (Oral)   Resp 19   Ht 4\' 10"  (1.473 m)   Wt 140 lb 6 oz (63.7 kg)   SpO2 95%   BMI 29.34 kg/m   Patient's Medications  New Prescriptions   No medications on file  Previous Medications   AMLODIPINE-OLMESARTAN (AZOR) 10-40 MG PER TABLET    Take 1 tablet by mouth daily.   ASPIRIN 81 MG TABLET    Take 81 mg by mouth daily.   DOCUSATE SODIUM (COLACE) 100 MG CAPSULE    Take 100 mg by mouth daily.   LATANOPROST (XALATAN) 0.005 % OPHTHALMIC SOLUTION    Place 1 drop into both eyes at bedtime.   MAGNESIUM HYDROXIDE (MILK OF MAGNESIA) 400 MG/5ML SUSPENSION    Take 30 mLs by mouth daily as needed for mild constipation.   METOPROLOL SUCCINATE (TOPROL-XL) 25 MG 24 HR TABLET    Take 25 mg by mouth daily.   MYRBETRIQ 50 MG TB24 TABLET    Take 50 mg by  mouth daily.   Modified Medications   No medications on file  Discontinued Medications   No medications on file     SIGNIFICANT DIAGNOSTIC EXAMS  12-24-14: left wrist x-ray: 1. Displaced distal radius fracture as described above. 2. Probable nondisplaced ulnar styloid fracture.   12-24-14: left forearm x-ray: Fractures of the distal left radius and ulnar styloid.   LABS REVIEWED:   03-10-15: wbc 7.0; hgb 11.; hct 33.87; mcv 86.3; plt 246; glucose 82; bun 12.7; creat 0.67; k+ 4.0; na++136; liver normal albumin 3.3; tsh 3.60     Review of Systems  Constitutional: Negative for malaise/fatigue.  HENT:       Hard of hearing   Eyes:       Left eye with light sensitivity  Both eyes red with swelling present   Respiratory: Negative for cough and shortness of breath.   Cardiovascular: Negative for chest pain, palpitations and leg swelling.  Gastrointestinal: Negative for abdominal pain, constipation and heartburn.    Musculoskeletal: Negative for back pain, joint pain and myalgias.  Skin: Negative.   Neurological: Negative for dizziness.  Psychiatric/Behavioral: The patient is not nervous/anxious.       Physical Exam  Constitutional: No distress.  Eyes:  Bilateral conjunctiva red with periorbital edema present.    Neck: Neck supple. No JVD present. No thyromegaly present.  Cardiovascular: Normal rate, regular rhythm and intact distal pulses.   Respiratory: Effort normal and breath sounds normal. No respiratory distress. She has no wheezes.  GI: Soft. Bowel sounds are normal. She exhibits no distension. There is no tenderness.  Musculoskeletal: She exhibits no edema.  Able to move all extremities  Ambulates throughout facility behind wheelchair  Lymphadenopathy:    She has no cervical adenopathy.  Neurological: She is alert.  Skin: Skin is warm and dry. She is not diaphoretic.  Psychiatric: She has a normal mood and affect.       ASSESSMENT/ PLAN:  1.  Hypertension: will continue Azor 10/40 mg daily; will continue toprol xl 25 mg daily and asa 81 mg daily   2. Dyslipidemia: is presently not on medications; will monitor   3. Hypothyroidism: is presently not on medications; will monitor tsh is 3.60   4. Urinary incontinence: will continue myrbetriq 50 mg daily;   5. Dementia: no significnat change in her status; is presently not on medications; will monitor her weight is stable at 140 pounds   6. Bilateral conjunctivitis  Will begin Bleph -10   2 drops both eyes four times daily for 10 days and will monitor    Will check cbc cmp tsh     Ok Edwards NP [facility]  Contact [phone] Monday through Friday 8am- 5pm  After hours call [phone]

## 2015-11-24 DIAGNOSIS — H02831 Dermatochalasis of right upper eyelid: Secondary | ICD-10-CM | POA: Diagnosis not present

## 2015-11-24 DIAGNOSIS — H4089 Other specified glaucoma: Secondary | ICD-10-CM | POA: Diagnosis not present

## 2015-11-24 DIAGNOSIS — H2511 Age-related nuclear cataract, right eye: Secondary | ICD-10-CM | POA: Diagnosis not present

## 2015-11-24 DIAGNOSIS — H524 Presbyopia: Secondary | ICD-10-CM | POA: Diagnosis not present

## 2015-11-24 DIAGNOSIS — Z961 Presence of intraocular lens: Secondary | ICD-10-CM | POA: Diagnosis not present

## 2015-12-10 ENCOUNTER — Encounter: Payer: Self-pay | Admitting: Adult Health

## 2015-12-10 ENCOUNTER — Non-Acute Institutional Stay (SKILLED_NURSING_FACILITY): Payer: Medicare Other | Admitting: Adult Health

## 2015-12-10 DIAGNOSIS — I10 Essential (primary) hypertension: Secondary | ICD-10-CM | POA: Diagnosis not present

## 2015-12-10 DIAGNOSIS — F0390 Unspecified dementia without behavioral disturbance: Secondary | ICD-10-CM

## 2015-12-10 DIAGNOSIS — E034 Atrophy of thyroid (acquired): Secondary | ICD-10-CM | POA: Diagnosis not present

## 2015-12-10 DIAGNOSIS — N393 Stress incontinence (female) (male): Secondary | ICD-10-CM | POA: Diagnosis not present

## 2015-12-10 NOTE — Progress Notes (Signed)
Patient ID: Cindy Hayes, female   DOB: 06-09-1913, 82 y.o.   MRN: [id number]   Location:   [facility] Room Number: 110-B Place of Service:  SNF (31)   CODE STATUS: DNR  Allergies  Allergen Reactions  . Hctz [Hydrochlorothiazide]   . Penicillins Hives    Has patient had a PCN reaction causing immediate rash, facial/tongue/throat swelling, SOB or lightheadedness with hypotension: No Has patient had a PCN reaction causing severe rash involving mucus membranes or skin necrosis: No (pt had hives and itchy rash)  Has patient had a PCN reaction that required hospitalization: No Has patient had a PCN reaction occurring within the last 10 years: No If all of the above answers are "NO", then may proceed with Cephalosporin use.     Chief Complaint  Patient presents with  . Medical Management of Chronic Issues    Follow up    HPI:  She is a long term resident of this facility being seen for the management of his chronic illnesses. Overall her status is stable. She remains ambulatory and will push her wheelchair. She is not voicing any complaints at this time. There are no nursing concerns at this time.    Past Medical History:  Diagnosis Date  . Dementia without behavioral disturbance 01/14/2015  . Diverticulosis   . Hard of hearing   . Hyperkalemia   . Hyperlipidemia   . Hypertension   . Memory loss   . Mitral regurgitation   . Onychomycosis of toenail   . Pedal edema     Past Surgical History:  Procedure Laterality Date  . anal stenosis surgery    . left renal artery stent  1990    Social History   Social History  . Marital status: Divorced    Spouse name: N/A  . Number of children: N/A  . Years of education: N/A   Occupational History  . Not on file.   Social History Main Topics  . Smoking status: Former Research scientist (life sciences)  . Smokeless tobacco: Never Used  . Alcohol use No  . Drug use: No  . Sexual activity: Not on file   Other Topics Concern  . Not  on file   Social History Narrative  . No narrative on file   Family History  Problem Relation Age of Onset  . Cancer Father   . Cancer Sister       VITAL SIGNS BP 118/70   Pulse 66   Temp 97.8 F (36.6 C) (Oral)   Resp 20   Ht 4\' 10"  (1.473 m)   Wt 141 lb 6 oz (64.1 kg)   SpO2 97%   BMI 29.55 kg/m   Patient's Medications  New Prescriptions   No medications on file  Previous Medications   AMLODIPINE-OLMESARTAN (AZOR) 10-40 MG PER TABLET    Take 1 tablet by mouth daily.   ASPIRIN 81 MG TABLET    Take 81 mg by mouth daily.   DOCUSATE SODIUM (COLACE) 100 MG CAPSULE    Take 100 mg by mouth daily.   LATANOPROST (XALATAN) 0.005 % OPHTHALMIC SOLUTION    Place 1 drop into both eyes at bedtime.   MAGNESIUM HYDROXIDE (MILK OF MAGNESIA) 400 MG/5ML SUSPENSION    Take 30 mLs by mouth daily as needed for mild constipation.   METOPROLOL SUCCINATE (TOPROL-XL) 25 MG 24 HR TABLET    Take 25 mg by mouth daily.   MYRBETRIQ 50 MG TB24 TABLET    Take 50 mg by  mouth daily.   Modified Medications   No medications on file  Discontinued Medications   No medications on file     SIGNIFICANT DIAGNOSTIC EXAMS  12-24-14: left wrist x-ray: 1. Displaced distal radius fracture as described above. 2. Probable nondisplaced ulnar styloid fracture.   12-24-14: left forearm x-ray: Fractures of the distal left radius and ulnar styloid.   LABS REVIEWED:   03-10-15: wbc 7.0; hgb 11.; hct 33.87; mcv 86.3; plt 246; glucose 82; bun 12.7; creat 0.67; k+ 4.0; na++136; liver normal albumin 3.3; tsh 3.60     Review of Systems  Constitutional: Negative for malaise/fatigue.  HENT:       Hard of hearing   Eyes:       Left eye with light sensitivity  Both eyes red with swelling present   Respiratory: Negative for cough and shortness of breath.   Cardiovascular: Negative for chest pain, palpitations and leg swelling.  Gastrointestinal: Negative for abdominal pain, constipation and heartburn.    Musculoskeletal: Negative for back pain, joint pain and myalgias.  Skin: Negative.   Neurological: Negative for dizziness.  Psychiatric/Behavioral: The patient is not nervous/anxious.       Physical Exam  Constitutional: No distress.  Eyes:  Bilateral conjunctiva red with periorbital edema present.    Neck: Neck supple. No JVD present. No thyromegaly present.  Cardiovascular: Normal rate, regular rhythm and intact distal pulses.   Respiratory: Effort normal and breath sounds normal. No respiratory distress. She has no wheezes.  GI: Soft. Bowel sounds are normal. She exhibits no distension. There is no tenderness.  Musculoskeletal: She exhibits no edema.  Able to move all extremities  Ambulates throughout facility behind wheelchair  Lymphadenopathy:    She has no cervical adenopathy.  Neurological: She is alert.  Skin: Skin is warm and dry. She is not diaphoretic.  Psychiatric: She has a normal mood and affect.       ASSESSMENT/ PLAN:  1.  Hypertension: will continue Azor 10/40 mg daily; will continue toprol xl 25 mg daily and asa 81 mg daily   2. Dyslipidemia: is presently not on medications; will monitor   3. Hypothyroidism: is presently not on medications; will monitor tsh is 3.60   4. Urinary incontinence: will continue myrbetriq 50 mg daily;   5. Dementia: no significnat change in her status; is presently not on medications; will monitor her weight is stable at 141 pounds     Ok Edwards NP [place] [phone] Monday through Friday 8am- 5pm  After hours call [phone]

## 2015-12-13 DIAGNOSIS — H2511 Age-related nuclear cataract, right eye: Secondary | ICD-10-CM | POA: Diagnosis not present

## 2015-12-13 DIAGNOSIS — H401124 Primary open-angle glaucoma, left eye, indeterminate stage: Secondary | ICD-10-CM | POA: Diagnosis not present

## 2015-12-13 DIAGNOSIS — Z961 Presence of intraocular lens: Secondary | ICD-10-CM | POA: Diagnosis not present

## 2015-12-13 DIAGNOSIS — D4981 Neoplasm of unspecified behavior of retina and choroid: Secondary | ICD-10-CM | POA: Diagnosis not present

## 2016-01-11 ENCOUNTER — Non-Acute Institutional Stay (SKILLED_NURSING_FACILITY): Payer: Medicare Other | Admitting: Adult Health

## 2016-01-11 DIAGNOSIS — F0390 Unspecified dementia without behavioral disturbance: Secondary | ICD-10-CM

## 2016-01-11 DIAGNOSIS — M150 Primary generalized (osteo)arthritis: Secondary | ICD-10-CM | POA: Diagnosis not present

## 2016-01-11 DIAGNOSIS — N393 Stress incontinence (female) (male): Secondary | ICD-10-CM

## 2016-01-11 DIAGNOSIS — I10 Essential (primary) hypertension: Secondary | ICD-10-CM | POA: Diagnosis not present

## 2016-01-11 DIAGNOSIS — M159 Polyosteoarthritis, unspecified: Secondary | ICD-10-CM

## 2016-01-11 DIAGNOSIS — E034 Atrophy of thyroid (acquired): Secondary | ICD-10-CM

## 2016-01-11 NOTE — Progress Notes (Signed)
Location:   [facility]   Place of Service:  SNF (31)   CODE STATUS: dnr  Allergies  Allergen Reactions  . Hctz [Hydrochlorothiazide]   . Penicillins Hives    Has patient had a PCN reaction causing immediate rash, facial/tongue/throat swelling, SOB or lightheadedness with hypotension: No Has patient had a PCN reaction causing severe rash involving mucus membranes or skin necrosis: No (pt had hives and itchy rash)  Has patient had a PCN reaction that required hospitalization: No Has patient had a PCN reaction occurring within the last 10 years: No If all of the above answers are "NO", then may proceed with Cephalosporin use.     Chief Complaint  Patient presents with  . Medical Management of Chronic Issues    HPI:  She is a long term resident of this facility being seen for the management of her chronic illnesses. Overall her status is stable. She is not voicing any complaints stating that she is feeling good. She is out and about throughout the facility   There are nursing concerns at this time.    Past Medical History:  Diagnosis Date  . Dementia without behavioral disturbance 01/14/2015  . Diverticulosis   . Hard of hearing   . Hyperkalemia   . Hyperlipidemia   . Hypertension   . Memory loss   . Mitral regurgitation   . Onychomycosis of toenail   . Pedal edema     Past Surgical History:  Procedure Laterality Date  . anal stenosis surgery    . left renal artery stent  1990    Social History   Social History  . Marital status: Divorced    Spouse name: N/A  . Number of children: N/A  . Years of education: N/A   Occupational History  . Not on file.   Social History Main Topics  . Smoking status: Former Research scientist (life sciences)  . Smokeless tobacco: Never Used  . Alcohol use No  . Drug use: No  . Sexual activity: Not on file   Other Topics Concern  . Not on file   Social History Narrative  . No narrative on file   Family History  Problem Relation Age of Onset  .  Cancer Father   . Cancer Sister       VITAL SIGNS BP 134/74   Pulse 70   Temp 97.8 F (36.6 C)   Resp 20   Ht 4\' 10"  (1.473 m)   Wt 140 lb 12.8 oz (63.9 kg)   SpO2 97%   BMI 29.43 kg/m   Patient's Medications  New Prescriptions   No medications on file  Previous Medications   AMLODIPINE-OLMESARTAN (AZOR) 10-40 MG PER TABLET    Take 1 tablet by mouth daily.   ASPIRIN 81 MG TABLET    Take 81 mg by mouth daily.   DOCUSATE SODIUM (COLACE) 100 MG CAPSULE    Take 100 mg by mouth daily.   LATANOPROST (XALATAN) 0.005 % OPHTHALMIC SOLUTION    Place 1 drop into both eyes at bedtime.   MAGNESIUM HYDROXIDE (MILK OF MAGNESIA) 400 MG/5ML SUSPENSION    Take 30 mLs by mouth daily as needed for mild constipation.   METOPROLOL SUCCINATE (TOPROL-XL) 25 MG 24 HR TABLET    Take 25 mg by mouth daily.   MYRBETRIQ 50 MG TB24 TABLET    Take 50 mg by mouth daily.   Modified Medications   No medications on file  Discontinued Medications   No medications on file  SIGNIFICANT DIAGNOSTIC EXAMS   12-24-14: left wrist x-ray: 1. Displaced distal radius fracture as described above. 2. Probable nondisplaced ulnar styloid fracture.   12-24-14: left forearm x-ray: Fractures of the distal left radius and ulnar styloid.   LABS REVIEWED:   03-10-15: wbc 7.0; hgb 11.; hct 33.87; mcv 86.3; plt 246; glucose 82; bun 12.7; creat 0.67; k+ 4.0; na++136; liver normal albumin 3.3; tsh 3.60  09-09-15; glucose 114; bun 14.5; creat 0.85; k+ 4.2; na++ 132; liver normal albumin 4.3; tsh 4.64    Review of Systems  Constitutional: Negative for malaise/fatigue.  HENT:       Hard of hearing   Eyes: no complaints  Respiratory: Negative for cough and shortness of breath.   Cardiovascular: Negative for chest pain, palpitations and leg swelling.  Gastrointestinal: Negative for abdominal pain, constipation and heartburn.  Musculoskeletal: Negative for back pain, joint pain and myalgias.  Skin: Negative.   Neurological:  Negative for dizziness.  Psychiatric/Behavioral: The patient is not nervous/anxious.       Physical Exam  Constitutional: No distress.  Eyes: conjunctiva without redness present.     Neck: Neck supple. No JVD present. No thyromegaly present.  Cardiovascular: Normal rate, regular rhythm and intact distal pulses.   Respiratory: Effort normal and breath sounds normal. No respiratory distress. She has no wheezes.  GI: Soft. Bowel sounds are normal. She exhibits no distension. There is no tenderness.  Musculoskeletal: She exhibits no edema.  Able to move all extremities  Ambulates throughout facility behind wheelchair  Lymphadenopathy:    She has no cervical adenopathy.  Neurological: She is alert.  Skin: Skin is warm and dry. She is not diaphoretic.  Psychiatric: She has a normal mood and affect.       ASSESSMENT/ PLAN:  1.  Hypertension: will continue Azor 10/40 mg daily; will continue toprol xl 25 mg daily and asa 81 mg daily   2. Dyslipidemia: is presently not on medications; will monitor   3. Hypothyroidism: is presently not on medications; will monitor tsh is 4.64   4. Urinary incontinence: will continue myrbetriq 50 mg daily;   5. Dementia: no significnat change in her status; is presently not on medications; will monitor her weight is stable at 140 pounds   6. Osteoarthritis: is presently no complaint of pain present; will monitor       Ok Edwards NP [facility]  Contact [phone] Monday through Friday 8am- 5pm  After hours call [phone]

## 2016-02-04 ENCOUNTER — Encounter: Payer: Self-pay | Admitting: Adult Health

## 2016-02-04 ENCOUNTER — Non-Acute Institutional Stay (SKILLED_NURSING_FACILITY): Payer: Medicare Other | Admitting: Adult Health

## 2016-02-04 DIAGNOSIS — I10 Essential (primary) hypertension: Secondary | ICD-10-CM

## 2016-02-04 DIAGNOSIS — M159 Polyosteoarthritis, unspecified: Secondary | ICD-10-CM

## 2016-02-04 DIAGNOSIS — F0390 Unspecified dementia without behavioral disturbance: Secondary | ICD-10-CM

## 2016-02-04 DIAGNOSIS — E034 Atrophy of thyroid (acquired): Secondary | ICD-10-CM | POA: Diagnosis not present

## 2016-02-04 DIAGNOSIS — M150 Primary generalized (osteo)arthritis: Secondary | ICD-10-CM | POA: Diagnosis not present

## 2016-02-04 NOTE — Progress Notes (Signed)
Provider:   Location:  [facility] Room Number: 110B Place of Service:  SNF (31)   PCP: Gildardo Cranker, DO Patient Care Team: Gildardo Cranker, DO as PCP - General (Internal Medicine)  Extended Emergency Contact Information Primary Emergency Contact: Esperanza Heir Address: [address]          [place] [address] [place] Phone: [phone] Relation: Son Secondary Emergency Contact: Rennis Chris [place] Phone: [phone] Relation: Other  Code Status: dnr Goals of Care: Advanced Directive information Advanced Directives 02/04/2016  Does Patient Have a Medical Advance Directive? Yes  Type of Advance Directive Out of facility DNR (pink MOST or yellow form)  Does patient want to make changes to medical advance directive? -  Copy of [place] in Chart? -  Would patient like information on creating a medical advance directive? -  Pre-existing out of facility DNR order (yellow form or pink MOST form) Pink MOST form placed in chart (order not valid for inpatient use)      Allergies  Allergen Reactions  . Hctz [Hydrochlorothiazide]   . Penicillins Hives    Has patient had a PCN reaction causing immediate rash, facial/tongue/throat swelling, SOB or lightheadedness with hypotension: No Has patient had a PCN reaction causing severe rash involving mucus membranes or skin necrosis: No (pt had hives and itchy rash)  Has patient had a PCN reaction that required hospitalization: No Has patient had a PCN reaction occurring within the last 10 years: No If all of the above answers are "NO", then may proceed with Cephalosporin use.      Chief Complaint  Patient presents with  . Annual Exam    History and Physical     HPI: Patient is a 83 y.o. female seen today for an annual comprehensive examination. She has not been hospitalized over the past year. She is doing well; she continues to participate with her  on daily care. She does ambulate in the halls. She tells me that she is feeling good and has no complaints. There are no nursing concerns at this time.    Past Medical History:  Diagnosis Date  . Dementia without behavioral disturbance 01/14/2015  . Diverticulosis   . Hard of hearing   . Hyperkalemia   . Hyperlipidemia   . Hypertension   . Memory loss   . Mitral regurgitation   . Onychomycosis of toenail   . Pedal edema    Past Surgical History:  Procedure Laterality Date  . anal stenosis surgery    . left renal artery stent  1990    reports that she has quit smoking. She has never used smokeless tobacco. She reports that she does not drink alcohol or use drugs. Social History   Social History  . Marital status: Divorced    Spouse name: N/A  . Number of children: N/A  . Years of education: N/A   Occupational History  . Not on file.   Social History Main Topics  . Smoking status: Former Research scientist (life sciences)  . Smokeless tobacco: Never Used  . Alcohol use No  . Drug use: No  . Sexual activity: Not on file   Other Topics Concern  . Not on file   Social History Narrative  . No narrative on file   Family History  Problem Relation Age of Onset  . Cancer Father   . Cancer Sister     Vitals:   02/04/16 1154  BP: 118/78  Pulse: 70  Resp: 20  Temp: 97.8 F (36.6 C)  SpO2: 97%  Weight: 140 lb 12.8 oz (63.9 kg)  Height: 4\' 10"  (1.473 m)   Body mass index is 29.43 kg/m.    Current Outpatient Prescriptions on File Prior to Visit  Medication Sig  . amLODipine-olmesartan (AZOR) 10-40 MG per tablet Take 1 tablet by mouth daily.  Marland Kitchen aspirin 81 MG tablet Take 81 mg by mouth daily.  Marland Kitchen docusate sodium (COLACE) 100 MG capsule Take 100 mg by mouth daily.  Marland Kitchen latanoprost (XALATAN) 0.005 % ophthalmic solution Place 1 drop into both eyes at bedtime.  . magnesium hydroxide (MILK OF MAGNESIA) 400 MG/5ML suspension Take 30 mLs by mouth daily as needed for mild constipation.  . metoprolol  succinate (TOPROL-XL) 25 MG 24 hr tablet Take 25 mg by mouth daily.  Marland Kitchen MYRBETRIQ 50 MG TB24 tablet Take 50 mg by mouth daily.    No current facility-administered medications on file prior to visit.      SIGNIFICANT DIAGNOSTIC EXAMS   12-24-14: left wrist x-ray: 1. Displaced distal radius fracture as described above. 2. Probable nondisplaced ulnar styloid fracture.   12-24-14: left forearm x-ray: Fractures of the distal left radius and ulnar styloid.   LABS REVIEWED:   03-10-15: wbc 7.0; hgb 11.; hct 33.87; mcv 86.3; plt 246; glucose 82; bun 12.7; creat 0.67; k+ 4.0; na++136; liver normal albumin 3.3; tsh 3.60  09-09-15; glucose 114; bun 14.5; creat 0.85; k+ 4.2; na++ 132; liver normal albumin 4.3; tsh 4.64    Review of Systems  Constitutional: Negative for malaise/fatigue.  HENT:       Hard of hearing   Eyes: no complaints  Respiratory: Negative for cough and shortness of breath.   Cardiovascular: Negative for chest pain, palpitations and leg swelling.  Gastrointestinal: Negative for abdominal pain, constipation and heartburn.  Musculoskeletal: Negative for back pain, joint pain and myalgias.  Skin: Negative.   Neurological: Negative for dizziness.  Psychiatric/Behavioral: The patient is not nervous/anxious.       Physical Exam  Constitutional: No distress.  Eyes: conjunctiva without redness present.     Neck: Neck supple. No JVD present. No thyromegaly present.  Cardiovascular: Normal rate, regular rhythm and intact distal pulses.   Respiratory: Effort normal and breath sounds normal. No respiratory distress. She has no wheezes.  GI: Soft. Bowel sounds are normal. She exhibits no distension. There is no tenderness.  Musculoskeletal: She exhibits no edema.  Able to move all extremities  Ambulates throughout facility behind wheelchair  Lymphadenopathy:    She has no cervical adenopathy.  Neurological: She is alert.  Skin: Skin is warm and dry. She is not diaphoretic.    Psychiatric: She has a normal mood and affect.    ASSESSMENT/ PLAN:  1.  Hypertension: will continue Azor 10/40 mg daily; will continue toprol xl 25 mg daily and asa 81 mg daily   2. Dyslipidemia: is presently not on medications; will monitor   3. Hypothyroidism: is presently not on medications; will monitor tsh is 4.64   4. Urinary incontinence: will continue myrbetriq 50 mg daily;   5. Dementia: no significnat change in her status; is presently not on medications; will monitor her weight is stable at 140 pounds   6. Osteoarthritis: is presently no complaint of pain present; will monitor    Her health care maintenance is up to date.  Time spent with patient 45   minutes >50% time spent counseling; reviewing medical record; tests; labs;  and developing future plan of care    Ok Edwards NP [facility]  Contact [phone] Monday through Friday 8am- 5pm  After hours call [phone]

## 2016-03-09 DIAGNOSIS — M79674 Pain in right toe(s): Secondary | ICD-10-CM | POA: Diagnosis not present

## 2016-03-09 DIAGNOSIS — I70203 Unspecified atherosclerosis of native arteries of extremities, bilateral legs: Secondary | ICD-10-CM | POA: Diagnosis not present

## 2016-03-09 DIAGNOSIS — M79675 Pain in left toe(s): Secondary | ICD-10-CM | POA: Diagnosis not present

## 2016-03-09 DIAGNOSIS — L84 Corns and callosities: Secondary | ICD-10-CM | POA: Diagnosis not present

## 2016-03-09 DIAGNOSIS — B351 Tinea unguium: Secondary | ICD-10-CM | POA: Diagnosis not present

## 2016-03-15 ENCOUNTER — Encounter: Payer: Self-pay | Admitting: Adult Health

## 2016-03-15 ENCOUNTER — Non-Acute Institutional Stay (SKILLED_NURSING_FACILITY): Payer: Medicare Other | Admitting: Adult Health

## 2016-03-15 DIAGNOSIS — F0390 Unspecified dementia without behavioral disturbance: Secondary | ICD-10-CM | POA: Diagnosis not present

## 2016-03-15 DIAGNOSIS — M159 Polyosteoarthritis, unspecified: Secondary | ICD-10-CM

## 2016-03-15 DIAGNOSIS — E034 Atrophy of thyroid (acquired): Secondary | ICD-10-CM | POA: Diagnosis not present

## 2016-03-15 DIAGNOSIS — N393 Stress incontinence (female) (male): Secondary | ICD-10-CM | POA: Diagnosis not present

## 2016-03-15 DIAGNOSIS — I10 Essential (primary) hypertension: Secondary | ICD-10-CM

## 2016-03-15 DIAGNOSIS — M150 Primary generalized (osteo)arthritis: Secondary | ICD-10-CM | POA: Diagnosis not present

## 2016-03-15 NOTE — Progress Notes (Signed)
Location:   [facility] Room Number: 110 B Place of Service:  SNF (31)   CODE STATUS: DNR  Allergies  Allergen Reactions  . Hctz [Hydrochlorothiazide]   . Penicillins Hives    Has patient had a PCN reaction causing immediate rash, facial/tongue/throat swelling, SOB or lightheadedness with hypotension: No Has patient had a PCN reaction causing severe rash involving mucus membranes or skin necrosis: No (pt had hives and itchy rash)  Has patient had a PCN reaction that required hospitalization: No Has patient had a PCN reaction occurring within the last 10 years: No If all of the above answers are "NO", then may proceed with Cephalosporin use.     Chief Complaint  Patient presents with  . Medical Management of Chronic Issues    Routine Visit    HPI:  She is a long term resident of this facility being seen for the management of her chronic illnesses. She is complaining of a cough and congestion which started yesterday. There are no reports of fevers present. She is out and about throughout the facility.   Past Medical History:  Diagnosis Date  . Dementia without behavioral disturbance 01/14/2015  . Diverticulosis   . Hard of hearing   . Hyperkalemia   . Hyperlipidemia   . Hypertension   . Memory loss   . Mitral regurgitation   . Onychomycosis of toenail   . Pedal edema     Past Surgical History:  Procedure Laterality Date  . anal stenosis surgery    . left renal artery stent  1990    Social History   Social History  . Marital status: Divorced    Spouse name: N/A  . Number of children: N/A  . Years of education: N/A   Occupational History  . Not on file.   Social History Main Topics  . Smoking status: Former Research scientist (life sciences)  . Smokeless tobacco: Never Used  . Alcohol use No  . Drug use: No  . Sexual activity: Not on file   Other Topics Concern  . Not on file   Social History Narrative  . No narrative on file   Family History  Problem Relation  Age of Onset  . Cancer Father   . Cancer Sister       VITAL SIGNS BP 122/70   Pulse 78   Temp 97.8 F (36.6 C)   Resp 20   Ht 4\' 10"  (1.473 m)   Wt 143 lb (64.9 kg)   SpO2 97%   BMI 29.89 kg/m   Patient's Medications  New Prescriptions   No medications on file  Previous Medications   AMLODIPINE-OLMESARTAN (AZOR) 10-40 MG PER TABLET    Take 1 tablet by mouth daily.   ASPIRIN 81 MG TABLET    Take 81 mg by mouth daily.   DOCUSATE SODIUM (COLACE) 100 MG CAPSULE    Take 100 mg by mouth daily.   LATANOPROST (XALATAN) 0.005 % OPHTHALMIC SOLUTION    Place 1 drop into both eyes at bedtime.   MAGNESIUM HYDROXIDE (MILK OF MAGNESIA) 400 MG/5ML SUSPENSION    Take 30 mLs by mouth daily as needed for mild constipation.   METOPROLOL SUCCINATE (TOPROL-XL) 25 MG 24 HR TABLET    Take 25 mg by mouth daily.   MYRBETRIQ 50 MG TB24 TABLET    Take 50 mg by mouth daily.   Modified Medications   No medications on file  Discontinued Medications   No medications on file  SIGNIFICANT DIAGNOSTIC EXAMS  12-24-14: left wrist x-ray: 1. Displaced distal radius fracture as described above. 2. Probable nondisplaced ulnar styloid fracture.   12-24-14: left forearm x-ray: Fractures of the distal left radius and ulnar styloid.   LABS REVIEWED:   03-10-15: wbc 7.0; hgb 11.; hct 33.87; mcv 86.3; plt 246; glucose 82; bun 12.7; creat 0.67; k+ 4.0; na++136; liver normal albumin 3.3; tsh 3.60  09-09-15; glucose 114; bun 14.5; creat 0.85; k+ 4.2; na++ 132; liver normal albumin 4.3; tsh 4.64    Review of Systems  Constitutional: Negative for malaise/fatigue.  HENT:       Hard of hearing   Eyes: no complaints  Respiratory: Negative for  shortness of breath.  has cough and congestion Cardiovascular: Negative for chest pain, palpitations and leg swelling.  Gastrointestinal: Negative for abdominal pain, constipation and heartburn.  Musculoskeletal: Negative for back pain, joint pain and myalgias.  Skin:  Negative.   Neurological: Negative for dizziness.  Psychiatric/Behavioral: The patient is not nervous/anxious.       Physical Exam  Constitutional: No distress.  Eyes: conjunctiva without redness present.     Neck: Neck supple. No JVD present. No thyromegaly present.  Cardiovascular: Normal rate, regular rhythm and intact distal pulses.   Respiratory: Effort normal and breath sounds normal. No respiratory distress. She has no wheezes.  GI: Soft. Bowel sounds are normal. She exhibits no distension. There is no tenderness.  Musculoskeletal: She exhibits no edema.  Able to move all extremities  Ambulates throughout facility behind wheelchair  Lymphadenopathy:    She has no cervical adenopathy.  Neurological: She is alert.  Skin: Skin is warm and dry. She is not diaphoretic.  Psychiatric: She has a normal mood and affect.    ASSESSMENT/ PLAN:  1.  Hypertension: will continue Azor 10/40 mg daily; will continue toprol xl 25 mg daily and asa 81 mg daily   2. Dyslipidemia: is presently not on medications; will monitor   3. Hypothyroidism: is presently not on medications; will monitor tsh is 4.64   4. Urinary incontinence: will continue myrbetriq 50 mg daily;   5. Dementia: no significnat change in her status; is presently not on medications; will monitor her weight is stable at 140 pounds   6. Osteoarthritis: is presently no complaint of pain present; will monitor   7. Cough: will begin mucinex twice daily and flonase nightly will monitor     Ok Edwards NP [facility]  Contact [phone] Monday through Friday 8am- 5pm  After hours call [phone]

## 2016-03-23 DIAGNOSIS — R918 Other nonspecific abnormal finding of lung field: Secondary | ICD-10-CM | POA: Diagnosis not present

## 2016-03-23 DIAGNOSIS — I10 Essential (primary) hypertension: Secondary | ICD-10-CM | POA: Diagnosis not present

## 2016-03-23 DIAGNOSIS — I517 Cardiomegaly: Secondary | ICD-10-CM | POA: Diagnosis not present

## 2016-03-23 DIAGNOSIS — D649 Anemia, unspecified: Secondary | ICD-10-CM | POA: Diagnosis not present

## 2016-03-23 LAB — CBC AND DIFFERENTIAL
HCT: 38 % (ref 36–46)
HEMOGLOBIN: 12.8 g/dL (ref 12.0–16.0)
Neutrophils Absolute: 5 /uL
PLATELETS: 267 10*3/uL (ref 150–399)
WBC: 8.1 10*3/mL

## 2016-03-23 LAB — BASIC METABOLIC PANEL
BUN: 14 mg/dL (ref 4–21)
Creatinine: 0.8 mg/dL (ref 0.5–1.1)
Glucose: 98 mg/dL
POTASSIUM: 4.1 mmol/L (ref 3.4–5.3)
Sodium: 134 mmol/L — AB (ref 137–147)

## 2016-03-28 ENCOUNTER — Encounter: Payer: Self-pay | Admitting: Adult Health

## 2016-03-28 ENCOUNTER — Non-Acute Institutional Stay (SKILLED_NURSING_FACILITY): Payer: Medicare Other | Admitting: Adult Health

## 2016-03-28 DIAGNOSIS — J189 Pneumonia, unspecified organism: Secondary | ICD-10-CM | POA: Diagnosis not present

## 2016-03-28 NOTE — Progress Notes (Signed)
Location:   [place] Room Number: 110 B Place of Service:  SNF (31)   CODE STATUS: DNR  Allergies  Allergen Reactions  . Hctz [Hydrochlorothiazide]   . Penicillins Hives    Has patient had a PCN reaction causing immediate rash, facial/tongue/throat swelling, SOB or lightheadedness with hypotension: No Has patient had a PCN reaction causing severe rash involving mucus membranes or skin necrosis: No (pt had hives and itchy rash)  Has patient had a PCN reaction that required hospitalization: No Has patient had a PCN reaction occurring within the last 10 years: No If all of the above answers are "NO", then may proceed with Cephalosporin use.     Chief Complaint  Patient presents with  . Acute Visit    Pneumonia    HPI:  She has had fever; cough with increased shortness of breath and confusion. Her chest x-ray on 03-23-16 demonstrates a right lower lobe infiltrate and she was started on avelox yesterday. She is still complaining of a cough.    Past Medical History:  Diagnosis Date  . Dementia without behavioral disturbance 01/14/2015  . Diverticulosis   . Hard of hearing   . Hyperkalemia   . Hyperlipidemia   . Hypertension   . Memory loss   . Mitral regurgitation   . Onychomycosis of toenail   . Pedal edema     Past Surgical History:  Procedure Laterality Date  . anal stenosis surgery    . left renal artery stent  1990    Social History   Social History  . Marital status: Divorced    Spouse name: N/A  . Number of children: N/A  . Years of education: N/A   Occupational History  . Not on file.   Social History Main Topics  . Smoking status: Former Research scientist (life sciences)  . Smokeless tobacco: Never Used  . Alcohol use No  . Drug use: No  . Sexual activity: Not on file   Other Topics Concern  . Not on file   Social History Narrative  . No narrative on file   Family History  Problem Relation Age of Onset  . Cancer Father   . Cancer Sister        VITAL SIGNS BP 117/61   Pulse 73   Temp 97.4 F (36.3 C)   Resp 17   Ht 4\' 10"  (1.473 m)   Wt 143 lb (64.9 kg)   SpO2 98%   BMI 29.89 kg/m   Patient's Medications  New Prescriptions   No medications on file  Previous Medications   AMLODIPINE-OLMESARTAN (AZOR) 10-40 MG PER TABLET    Take 1 tablet by mouth daily.   ASPIRIN 81 MG TABLET    Take 81 mg by mouth daily.   DOCUSATE SODIUM (COLACE) 100 MG CAPSULE    Take 100 mg by mouth daily.   FLUTICASONE PROPIONATE, NASAL, NA    Give 2 inhalations in both nostrils at bedtime   LATANOPROST (XALATAN) 0.005 % OPHTHALMIC SOLUTION    Place 1 drop into both eyes at bedtime.   MAGNESIUM HYDROXIDE (MILK OF MAGNESIA) 400 MG/5ML SUSPENSION    Take 30 mLs by mouth daily as needed for mild constipation.   METOPROLOL SUCCINATE (TOPROL-XL) 25 MG 24 HR TABLET    Take 25 mg by mouth daily.   MOXIFLOXACIN (AVELOX) 400 MG TABLET    Take 400 mg by mouth daily at 8 pm.   MYRBETRIQ 50 MG TB24 TABLET    Take 50 mg  by mouth daily.    SACCHAROMYCES BOULARDII PO    Take 1 capsule by mouth 2 (two) times daily.  Modified Medications   No medications on file  Discontinued Medications   No medications on file     SIGNIFICANT DIAGNOSTIC EXAMS  12-24-14: left wrist x-ray: 1. Displaced distal radius fracture as described above. 2. Probable nondisplaced ulnar styloid fracture.   12-24-14: left forearm x-ray: Fractures of the distal left radius and ulnar styloid.  03-23-16: chest x-ray: mild right lower lobe infiltrate. There is mild cardiomegaly    LABS REVIEWED:   09-09-15; glucose 114; bun 14.5; creat 0.85; k+ 4.2; na++ 132; liver normal albumin 4.3; tsh 4.64  03-23-16: wbc 8.1; hgb 12.8; hct 37.7 mcv 89.1; plt 267; glucose 98; bun 13.5; creat 0.81; k+ 4.1; na++ 134    Review of Systems  Constitutional: Negative for malaise/fatigue.  HENT:       Hard of hearing   Eyes: no complaints  Respiratory: Negative for  shortness of breath.  has cough  and congestion Cardiovascular: Negative for chest pain, palpitations and leg swelling.  Gastrointestinal: Negative for abdominal pain, constipation and heartburn.  Musculoskeletal: Negative for back pain, joint pain and myalgias.  Skin: Negative.   Neurological: Negative for dizziness.  Psychiatric/Behavioral: The patient is not nervous/anxious.       Physical Exam  Constitutional: No distress.  Eyes: conjunctiva without redness present.     Neck: Neck supple. No JVD present. No thyromegaly present.  Cardiovascular: Normal rate, regular rhythm and intact distal pulses.   Respiratory: has few expiratory wheezes present  GI: Soft. Bowel sounds are normal. She exhibits no distension. There is no tenderness.  Musculoskeletal: She exhibits no edema.  Able to move all extremities  Ambulates throughout facility behind wheelchair  Lymphadenopathy:    She has no cervical adenopathy.  Neurological: She is alert.  Skin: Skin is warm and dry. She is not diaphoretic.  Psychiatric: She has a normal mood and affect.    ASSESSMENT/ PLAN:  1. Pneumonia: will complete avelox and will restart mucinex twice daily for 10 days and will monitor her status   Ok Edwards NP [facility]  Contact [phone] Monday through Friday 8am- 5pm  After hours call [phone]

## 2016-04-12 ENCOUNTER — Encounter: Payer: Self-pay | Admitting: Adult Health

## 2016-04-12 ENCOUNTER — Non-Acute Institutional Stay (SKILLED_NURSING_FACILITY): Payer: Medicare Other | Admitting: Adult Health

## 2016-04-12 DIAGNOSIS — F0390 Unspecified dementia without behavioral disturbance: Secondary | ICD-10-CM | POA: Diagnosis not present

## 2016-04-12 DIAGNOSIS — E034 Atrophy of thyroid (acquired): Secondary | ICD-10-CM

## 2016-04-12 DIAGNOSIS — I10 Essential (primary) hypertension: Secondary | ICD-10-CM

## 2016-04-12 DIAGNOSIS — N393 Stress incontinence (female) (male): Secondary | ICD-10-CM | POA: Diagnosis not present

## 2016-04-12 NOTE — Progress Notes (Signed)
Location:   [place] Room Number: 110 B Place of Service:  SNF (31)   CODE STATUS: DNR  Allergies  Allergen Reactions  . Hctz [Hydrochlorothiazide]   . Penicillins Hives    Has patient had a PCN reaction causing immediate rash, facial/tongue/throat swelling, SOB or lightheadedness with hypotension: No Has patient had a PCN reaction causing severe rash involving mucus membranes or skin necrosis: No (pt had hives and itchy rash)  Has patient had a PCN reaction that required hospitalization: No Has patient had a PCN reaction occurring within the last 10 years: No If all of the above answers are "NO", then may proceed with Cephalosporin use.     Chief Complaint  Patient presents with  . Medical Management of Chronic Issues    1 month follow up    HPI:  She is a long term resident of this facility being seen for the management of her chronic illnesses. Overall her status is stable. She tells me that she is feeling good. There are no nursing concerns at this time.   Past Medical History:  Diagnosis Date  . Dementia without behavioral disturbance 01/14/2015  . Diverticulosis   . Hard of hearing   . Hyperkalemia   . Hyperlipidemia   . Hypertension   . Memory loss   . Mitral regurgitation   . Onychomycosis of toenail   . Pedal edema     Past Surgical History:  Procedure Laterality Date  . anal stenosis surgery    . left renal artery stent  1990    Social History   Social History  . Marital status: Divorced    Spouse name: N/A  . Number of children: N/A  . Years of education: N/A   Occupational History  . Not on file.   Social History Main Topics  . Smoking status: Former Research scientist (life sciences)  . Smokeless tobacco: Never Used  . Alcohol use No  . Drug use: No  . Sexual activity: Not on file   Other Topics Concern  . Not on file   Social History Narrative  . No narrative on file   Family History  Problem Relation Age of Onset  . Cancer Father   .  Cancer Sister       VITAL SIGNS BP 120/70   Pulse 78   Temp 97.3 F (36.3 C)   Resp 17   Ht 4\' 10"  (1.473 m)   Wt 143 lb 4.8 oz (65 kg)   SpO2 98%   BMI 29.95 kg/m   Patient's Medications  New Prescriptions   No medications on file  Previous Medications   AMLODIPINE-OLMESARTAN (AZOR) 10-40 MG PER TABLET    Take 1 tablet by mouth daily.   ASPIRIN 81 MG TABLET    Take 81 mg by mouth daily.   DOCUSATE SODIUM (COLACE) 100 MG CAPSULE    Take 100 mg by mouth daily.   FLUTICASONE PROPIONATE, NASAL, NA    Give 2 inhalations in both nostrils at bedtime   LATANOPROST (XALATAN) 0.005 % OPHTHALMIC SOLUTION    Place 1 drop into both eyes at bedtime.   MAGNESIUM HYDROXIDE (MILK OF MAGNESIA) 400 MG/5ML SUSPENSION    Take 30 mLs by mouth daily as needed for mild constipation.   METOPROLOL SUCCINATE (TOPROL-XL) 25 MG 24 HR TABLET    Take 25 mg by mouth daily.   MYRBETRIQ 50 MG TB24 TABLET    Take 50 mg by mouth daily.   Modified Medications   No  medications on file  Discontinued Medications   MOXIFLOXACIN (AVELOX) 400 MG TABLET    Take 400 mg by mouth daily at 8 pm.   SACCHAROMYCES BOULARDII PO    Take 1 capsule by mouth 2 (two) times daily.     SIGNIFICANT DIAGNOSTIC EXAMS  12-24-14: left wrist x-ray: 1. Displaced distal radius fracture as described above. 2. Probable nondisplaced ulnar styloid fracture.   12-24-14: left forearm x-ray: Fractures of the distal left radius and ulnar styloid.   LABS REVIEWED:   03-10-15: wbc 7.0; hgb 11.; hct 33.87; mcv 86.3; plt 246; glucose 82; bun 12.7; creat 0.67; k+ 4.0; na++136; liver normal albumin 3.3; tsh 3.60  09-09-15; glucose 114; bun 14.5; creat 0.85; k+ 4.2; na++ 132; liver normal albumin 4.3; tsh 4.64    Review of Systems  Constitutional: Negative for malaise/fatigue.  HENT:       Hard of hearing   Eyes: no complaints  Respiratory: Negative for  shortness of breath.   Cardiovascular: Negative for chest pain, palpitations and leg  swelling.  Gastrointestinal: Negative for abdominal pain, constipation and heartburn.  Musculoskeletal: Negative for back pain, joint pain and myalgias.  Skin: Negative.   Neurological: Negative for dizziness.  Psychiatric/Behavioral: The patient is not nervous/anxious.       Physical Exam  Constitutional: No distress.  Eyes: conjunctiva without redness present.     Neck: Neck supple. No JVD present. No thyromegaly present.  Cardiovascular: Normal rate, regular rhythm and intact distal pulses.   Respiratory: Effort normal and breath sounds normal. No respiratory distress. She has no wheezes.  GI: Soft. Bowel sounds are normal. She exhibits no distension. There is no tenderness.  Musculoskeletal: She exhibits no edema.  Able to move all extremities  Ambulates throughout facility behind wheelchair  Lymphadenopathy:    She has no cervical adenopathy.  Neurological: She is alert.  Skin: Skin is warm and dry. She is not diaphoretic.  Psychiatric: She has a normal mood and affect.    ASSESSMENT/ PLAN:  1.  Hypertension:b/p: 120/70  will continue Azor 10/40 mg daily; will continue toprol xl 25 mg daily and asa 81 mg daily   2. Dyslipidemia: is presently not on medications; will monitor   3. Hypothyroidism: is presently not on medications; will monitor tsh is 4.64   4. Urinary incontinence: will continue myrbetriq 50 mg daily;   5. Dementia: no significnat change in her status; is presently not on medications; will monitor her weight is stable at 143 pounds   6. Osteoarthritis: is presently no complaint of pain present; will monitor     Ok Edwards NP [facility]  Contact [phone] Monday through Friday 8am- 5pm  After hours call [phone]

## 2016-04-17 DIAGNOSIS — H401134 Primary open-angle glaucoma, bilateral, indeterminate stage: Secondary | ICD-10-CM | POA: Diagnosis not present

## 2016-04-17 DIAGNOSIS — Z961 Presence of intraocular lens: Secondary | ICD-10-CM | POA: Diagnosis not present

## 2016-04-17 DIAGNOSIS — J189 Pneumonia, unspecified organism: Secondary | ICD-10-CM | POA: Insufficient documentation

## 2016-04-17 DIAGNOSIS — D4981 Neoplasm of unspecified behavior of retina and choroid: Secondary | ICD-10-CM | POA: Diagnosis not present

## 2016-04-17 DIAGNOSIS — H2513 Age-related nuclear cataract, bilateral: Secondary | ICD-10-CM | POA: Diagnosis not present

## 2016-05-12 ENCOUNTER — Non-Acute Institutional Stay (SKILLED_NURSING_FACILITY): Payer: Medicare Other | Admitting: Adult Health

## 2016-05-12 ENCOUNTER — Encounter: Payer: Self-pay | Admitting: Adult Health

## 2016-05-12 DIAGNOSIS — I10 Essential (primary) hypertension: Secondary | ICD-10-CM | POA: Diagnosis not present

## 2016-05-12 DIAGNOSIS — E034 Atrophy of thyroid (acquired): Secondary | ICD-10-CM

## 2016-05-12 DIAGNOSIS — F0390 Unspecified dementia without behavioral disturbance: Secondary | ICD-10-CM

## 2016-05-12 DIAGNOSIS — M150 Primary generalized (osteo)arthritis: Secondary | ICD-10-CM

## 2016-05-12 DIAGNOSIS — N393 Stress incontinence (female) (male): Secondary | ICD-10-CM

## 2016-05-12 DIAGNOSIS — M159 Polyosteoarthritis, unspecified: Secondary | ICD-10-CM

## 2016-05-12 NOTE — Progress Notes (Signed)
Location:   [place] Room Number: 110 B Place of Service:  SNF (31)   CODE STATUS: DNR  Allergies  Allergen Reactions  . Hctz [Hydrochlorothiazide]   . Penicillins Hives    Has patient had a PCN reaction causing immediate rash, facial/tongue/throat swelling, SOB or lightheadedness with hypotension: No Has patient had a PCN reaction causing severe rash involving mucus membranes or skin necrosis: No (pt had hives and itchy rash)  Has patient had a PCN reaction that required hospitalization: No Has patient had a PCN reaction occurring within the last 10 years: No If all of the above answers are "NO", then may proceed with Cephalosporin use.     Chief Complaint  Patient presents with  . Medical Management of Chronic Issues    1 month follow up    HPI:  She is a long term resident of this facility being seen for the management of her chronic illnesses. Overall her status is stable. She tells me that she is feeling good and has no complaints. She is up and about throughout the facility. There are no nursing concerns at this time. '  Past Medical History:  Diagnosis Date  . Dementia without behavioral disturbance 01/14/2015  . Diverticulosis   . Hard of hearing   . Hyperkalemia   . Hyperlipidemia   . Hypertension   . Memory loss   . Mitral regurgitation   . Onychomycosis of toenail   . Pedal edema     Past Surgical History:  Procedure Laterality Date  . anal stenosis surgery    . left renal artery stent  1990    Social History   Social History  . Marital status: Divorced    Spouse name: N/A  . Number of children: N/A  . Years of education: N/A   Occupational History  . Not on file.   Social History Main Topics  . Smoking status: Former Research scientist (life sciences)  . Smokeless tobacco: Never Used  . Alcohol use No  . Drug use: No  . Sexual activity: Not on file   Other Topics Concern  . Not on file   Social History Narrative  . No narrative on file   Family  History  Problem Relation Age of Onset  . Cancer Father   . Cancer Sister       VITAL SIGNS BP 136/68   Pulse 68   Temp (!) 96 F (35.6 C)   Resp 16   Ht 4\' 10"  (1.473 m)   Wt 143 lb 5 oz (65 kg)   SpO2 96%   BMI 29.95 kg/m   Patient's Medications  New Prescriptions   No medications on file  Previous Medications   AMLODIPINE-OLMESARTAN (AZOR) 10-40 MG PER TABLET    Take 1 tablet by mouth daily.   ASPIRIN 81 MG TABLET    Take 81 mg by mouth daily.   DOCUSATE SODIUM (COLACE) 100 MG CAPSULE    Take 100 mg by mouth daily.   FLUTICASONE PROPIONATE, NASAL, NA    Give 2 inhalations in both nostrils at bedtime   LATANOPROST (XALATAN) 0.005 % OPHTHALMIC SOLUTION    Place 1 drop into both eyes at bedtime.   MAGNESIUM HYDROXIDE (MILK OF MAGNESIA) 400 MG/5ML SUSPENSION    Take 30 mLs by mouth daily as needed for mild constipation.   METOPROLOL SUCCINATE (TOPROL-XL) 25 MG 24 HR TABLET    Take 25 mg by mouth daily.   MYRBETRIQ 50 MG TB24 TABLET  Take 50 mg by mouth daily.   Modified Medications   No medications on file  Discontinued Medications   No medications on file     SIGNIFICANT DIAGNOSTIC EXAMS  12-24-14: left wrist x-ray: 1. Displaced distal radius fracture as described above. 2. Probable nondisplaced ulnar styloid fracture.   12-24-14: left forearm x-ray: Fractures of the distal left radius and ulnar styloid.   LABS REVIEWED:   09-09-15; glucose 114; bun 14.5; creat 0.85; k+ 4.2; na++ 132; liver normal albumin 4.3; tsh 4.64    Review of Systems  Constitutional: Negative for malaise/fatigue.  HENT:       Hard of hearing   Eyes: no complaints  Respiratory: Negative for  shortness of breath.   Cardiovascular: Negative for chest pain, palpitations and leg swelling.  Gastrointestinal: Negative for abdominal pain, constipation and heartburn.  Musculoskeletal: Negative for back pain, joint pain and myalgias.  Skin: Negative.   Neurological: Negative for dizziness.    Psychiatric/Behavioral: The patient is not nervous/anxious.       Physical Exam  Constitutional: No distress.  Eyes: conjunctiva without redness present.     Neck: Neck supple. No JVD present. No thyromegaly present.  Cardiovascular: Normal rate, regular rhythm and intact distal pulses.   Respiratory: Effort normal and breath sounds normal. No respiratory distress. She has no wheezes.  GI: Soft. Bowel sounds are normal. She exhibits no distension. There is no tenderness.  Musculoskeletal: She exhibits no edema.  Able to move all extremities  Ambulates throughout facility behind wheelchair  Lymphadenopathy:    She has no cervical adenopathy.  Neurological: She is alert.  Skin: Skin is warm and dry. She is not diaphoretic.  Psychiatric: She has a normal mood and affect.    ASSESSMENT/ PLAN:  1.  Hypertension:b/p: 136/68  will continue Azor 10/40 mg daily; will continue toprol xl 25 mg daily and asa 81 mg daily   2. Dyslipidemia: is presently not on medications; will monitor   3. Hypothyroidism: is presently not on medications; will monitor tsh is 4.64   4. Urinary incontinence: will continue myrbetriq 50 mg daily;   5. Dementia: no significnat change in her status; is presently not on medications; will monitor her weight is stable at 143 pounds   6. Osteoarthritis: is presently no complaint of pain present; will monitor    MD is aware of resident's narcotic use and is in agreement with current plan of care. We will attempt to wean resident as apropriate    Ok Edwards NP [facility]  Contact [phone] Monday through Friday 8am- 5pm  After hours call [phone]

## 2016-06-07 ENCOUNTER — Non-Acute Institutional Stay (SKILLED_NURSING_FACILITY): Payer: Medicare Other | Admitting: Adult Health

## 2016-06-07 ENCOUNTER — Encounter: Payer: Self-pay | Admitting: Adult Health

## 2016-06-07 DIAGNOSIS — E785 Hyperlipidemia, unspecified: Secondary | ICD-10-CM | POA: Diagnosis not present

## 2016-06-07 DIAGNOSIS — F0390 Unspecified dementia without behavioral disturbance: Secondary | ICD-10-CM

## 2016-06-07 DIAGNOSIS — N393 Stress incontinence (female) (male): Secondary | ICD-10-CM

## 2016-06-07 DIAGNOSIS — I10 Essential (primary) hypertension: Secondary | ICD-10-CM | POA: Diagnosis not present

## 2016-06-07 DIAGNOSIS — E034 Atrophy of thyroid (acquired): Secondary | ICD-10-CM

## 2016-06-07 NOTE — Progress Notes (Signed)
Location:   [place] Room Number: 110 B Place of Service:  SNF (31)   CODE STATUS: DNR  Allergies  Allergen Reactions  . Hctz [Hydrochlorothiazide]   . Penicillins Hives    Has patient had a PCN reaction causing immediate rash, facial/tongue/throat swelling, SOB or lightheadedness with hypotension: No Has patient had a PCN reaction causing severe rash involving mucus membranes or skin necrosis: No (pt had hives and itchy rash)  Has patient had a PCN reaction that required hospitalization: No Has patient had a PCN reaction occurring within the last 10 years: No If all of the above answers are "NO", then may proceed with Cephalosporin use.     Chief Complaint  Patient presents with  . Medical Management of Chronic Issues    1 month follow up    HPI:  She is a 83 year old long term resident of this facility being seen for the management of her chronic illnesses. Overall her status is without change. She tells me that she is feeling good and has no complaints. There are no nursing concerns at this time.    Past Medical History:  Diagnosis Date  . Dementia without behavioral disturbance 01/14/2015  . Diverticulosis   . Hard of hearing   . Hyperkalemia   . Hyperlipidemia   . Hypertension   . Memory loss   . Mitral regurgitation   . Onychomycosis of toenail   . Pedal edema     Past Surgical History:  Procedure Laterality Date  . anal stenosis surgery    . left renal artery stent  1990    Social History   Social History  . Marital status: Divorced    Spouse name: N/A  . Number of children: N/A  . Years of education: N/A   Occupational History  . Not on file.   Social History Main Topics  . Smoking status: Former Research scientist (life sciences)  . Smokeless tobacco: Never Used  . Alcohol use No  . Drug use: No  . Sexual activity: Not on file   Other Topics Concern  . Not on file   Social History Narrative  . No narrative on file   Family History  Problem  Relation Age of Onset  . Cancer Father   . Cancer Sister       VITAL SIGNS BP 121/72   Pulse 75   Temp 98.5 F (36.9 C)   Resp 16   Ht 4\' 10"  (1.473 m)   Wt 143 lb 5 oz (65 kg)   SpO2 95%   BMI 29.95 kg/m   Patient's Medications  New Prescriptions   No medications on file  Previous Medications   AMLODIPINE (NORVASC) 10 MG TABLET    Take 10 mg by mouth daily.   ASPIRIN 81 MG TABLET    Take 81 mg by mouth daily.   DOCUSATE SODIUM (COLACE) 100 MG CAPSULE    Take 100 mg by mouth daily.   FLUTICASONE PROPIONATE, NASAL, NA    Give 2 inhalations in both nostrils at bedtime   LATANOPROST (XALATAN) 0.005 % OPHTHALMIC SOLUTION    Place 1 drop into both eyes at bedtime.   MAGNESIUM HYDROXIDE (MILK OF MAGNESIA) 400 MG/5ML SUSPENSION    Take 30 mLs by mouth daily as needed for mild constipation.   METOPROLOL SUCCINATE (TOPROL-XL) 25 MG 24 HR TABLET    Take 25 mg by mouth daily.   MYRBETRIQ 50 MG TB24 TABLET    Take 50 mg by mouth  daily.    OLMESARTAN (BENICAR) 40 MG TABLET    Take 40 mg by mouth daily.  Modified Medications   No medications on file  Discontinued Medications   AMLODIPINE-OLMESARTAN (AZOR) 10-40 MG PER TABLET    Take 1 tablet by mouth daily.     SIGNIFICANT DIAGNOSTIC EXAMS  12-24-14: left wrist x-ray: 1. Displaced distal radius fracture as described above. 2. Probable nondisplaced ulnar styloid fracture.   12-24-14: left forearm x-ray: Fractures of the distal left radius and ulnar styloid.   LABS REVIEWED:   09-09-15; glucose 114; bun 14.5; creat 0.85; k+ 4.2; na++ 132; liver normal albumin 4.3; tsh 4.64  03-23-16: wbc 8.1; hgb 12.8; hct 37.7; mcv 89.1 plt 267; glucose 98; bun 13.5; create 0.81; k+ 4.1; na++ 134; ca 9.2    Review of Systems  Constitutional: Negative for malaise/fatigue.  HENT:       Hard of hearing   Eyes: no complaints  Respiratory: Negative for  shortness of breath.   Cardiovascular: Negative for chest pain, palpitations and leg swelling.    Gastrointestinal: Negative for abdominal pain, constipation and heartburn.  Musculoskeletal: Negative for back pain, joint pain and myalgias.  Skin: Negative.   Neurological: Negative for dizziness.  Psychiatric/Behavioral: The patient is not nervous/anxious.       Physical Exam  Constitutional: No distress.  Eyes: conjunctiva without redness present.     Neck: Neck supple. No JVD present. No thyromegaly present.  Cardiovascular: Normal rate, regular rhythm and intact distal pulses.   Respiratory: Effort normal and breath sounds normal. No respiratory distress. She has no wheezes.  GI: Soft. Bowel sounds are normal. She exhibits no distension. There is no tenderness.  Musculoskeletal: She exhibits no edema.  Able to move all extremities  Ambulates throughout facility behind wheelchair  Lymphadenopathy:    She has no cervical adenopathy.  Neurological: She is alert.  Skin: Skin is warm and dry. She is not diaphoretic.  Psychiatric: She has a normal mood and affect.    ASSESSMENT/ PLAN:  1.  Hypertension:b/p: 1321/72  will continue norvasc 10 mg daily benicar 40  mg daily; will continue toprol xl 25 mg daily and asa 81 mg daily   2. Dyslipidemia: is presently not on medications; will monitor   3. Hypothyroidism: is presently not on medications; will monitor tsh is 4.64   4. Urinary incontinence: will continue myrbetriq 50 mg daily;   5. Dementia: no significnat change in her status; is presently not on medications; will monitor her weight is stable at 143 pounds   6. Osteoarthritis: is presently no complaint of pain present; will monitor       Ok Edwards NP [facility]  Contact [phone] Monday through Friday 8am- 5pm  After hours call [phone]

## 2016-06-08 DIAGNOSIS — E785 Hyperlipidemia, unspecified: Secondary | ICD-10-CM | POA: Diagnosis not present

## 2016-06-08 DIAGNOSIS — E039 Hypothyroidism, unspecified: Secondary | ICD-10-CM | POA: Diagnosis not present

## 2016-06-08 DIAGNOSIS — I10 Essential (primary) hypertension: Secondary | ICD-10-CM | POA: Diagnosis not present

## 2016-06-08 DIAGNOSIS — R69 Illness, unspecified: Secondary | ICD-10-CM | POA: Diagnosis not present

## 2016-06-08 LAB — BASIC METABOLIC PANEL
BUN: 14 (ref 4–21)
Creatinine: 0.8 (ref 0.5–1.1)
Glucose: 87
POTASSIUM: 4 (ref 3.4–5.3)
SODIUM: 137 (ref 137–147)

## 2016-06-08 LAB — TSH: TSH: 4.37 (ref 0.41–5.90)

## 2016-06-08 LAB — HEPATIC FUNCTION PANEL
ALK PHOS: 105 (ref 25–125)
ALT: 8 (ref 7–35)
AST: 15 (ref 13–35)
BILIRUBIN, TOTAL: 0.3

## 2016-06-19 ENCOUNTER — Non-Acute Institutional Stay (SKILLED_NURSING_FACILITY): Payer: Medicare Other | Admitting: Internal Medicine

## 2016-06-19 ENCOUNTER — Encounter: Payer: Self-pay | Admitting: Internal Medicine

## 2016-06-19 DIAGNOSIS — B359 Dermatophytosis, unspecified: Secondary | ICD-10-CM | POA: Diagnosis not present

## 2016-06-19 NOTE — Progress Notes (Signed)
DATE:  June 19, 2016  Location:   [facility] Room Number: 110 B Place of Service: SNF (31)   Extended Emergency Contact Information Primary Emergency Contact: Esperanza Heir Address: [address]          [place] [address] [place] Phone: [phone] Relation: Son Secondary Emergency Contact: Rennis Chris [place] of [place] Phone: [phone] Relation: Other  Advanced Directive information Does Patient Have a Medical Advance Directive?: Yes, Type of Advance Directive: Out of facility DNR (pink MOST or yellow form), Pre-existing out of facility DNR order (yellow form or pink MOST form): Yellow form placed in chart (order not valid for inpatient use);Pink MOST form placed in chart (order not valid for inpatient use), Does patient want to make changes to medical advance directive?: No - Patient declined  Chief Complaint  Patient presents with  . Acute Visit    Red rash under breast    HPI:  83 yo female long term resident seen today for rash under her breasts for unknown duration. She reports it is itchy. No recent insect bites. No blisters. She reports she sweats a lot. She does not wear a bra. No f/c. She is a poor historian due to dementia. Hx obtained from chart  Hypertension - BP stable on Azor 10/40 mg daily; toprol xl 25 mg daily; she takes ASA 81 mg daily for anticoagulation  Dyslipidemia - diet controlled  Hypothyroidism - stable off meds. TSH 4.37. She is asymptomatic  Urinary incontinence - stable on myrbetriq 50 mg daily;   Dementia - stable. She takes no meds for cognition. Albumin 3.8  Osteoarthritis - no pain at this time  Past Medical History:  Diagnosis Date  . Dementia without behavioral disturbance 01/14/2015  . Diverticulosis   . Hard of hearing   . Hyperkalemia   . Hyperlipidemia   . Hypertension   . Memory loss   . Mitral regurgitation   . Onychomycosis of toenail   . Pedal edema      Past Surgical History:  Procedure Laterality Date  . anal stenosis surgery    . left renal artery stent  1990    Patient Care Team: Gildardo Cranker, DO as PCP - General (Internal Medicine) Nyoka Cowden Phylis Bougie, NP as Nurse Practitioner ([facility], [place] ([place])  Social History   Social History  . Marital status: Divorced    Spouse name: N/A  . Number of children: N/A  . Years of education: N/A   Occupational History  . Not on file.   Social History Main Topics  . Smoking status: Former Research scientist (life sciences)  . Smokeless tobacco: Never Used  . Alcohol use No  . Drug use: No  . Sexual activity: Not on file   Other Topics Concern  . Not on file   Social History Narrative  . No narrative on file     reports that she has quit smoking. She has never used smokeless tobacco. She reports that she does not drink alcohol or use drugs.  Family History  Problem Relation Age of Onset  . Cancer Father   . Cancer Sister    Family Status  Relation Status  . Father (Not Specified)  . Sister (Not Specified)    Immunization History  Administered Date(s) Administered  . PPD Test 09/10/2015, 09/17/2015    Allergies  Allergen Reactions  . Hctz [Hydrochlorothiazide]   . Penicillins Hives    Has patient had a PCN  reaction causing immediate rash, facial/tongue/throat swelling, SOB or lightheadedness with hypotension: No Has patient had a PCN reaction causing severe rash involving mucus membranes or skin necrosis: No (pt had hives and itchy rash)  Has patient had a PCN reaction that required hospitalization: No Has patient had a PCN reaction occurring within the last 10 years: No If all of the above answers are "NO", then may proceed with Cephalosporin use.     Medications: Patient's Medications  New Prescriptions   No medications on file  Previous Medications   AMLODIPINE (NORVASC) 10 MG TABLET    Take 10 mg by mouth daily.   ASPIRIN 81  MG TABLET    Take 81 mg by mouth daily.   DOCUSATE SODIUM (COLACE) 100 MG CAPSULE    Take 100 mg by mouth daily.   FLUTICASONE PROPIONATE, NASAL, NA    Give 2 inhalations in both nostrils at bedtime   LATANOPROST (XALATAN) 0.005 % OPHTHALMIC SOLUTION    Place 1 drop into both eyes at bedtime.   MAGNESIUM HYDROXIDE (MILK OF MAGNESIA) 400 MG/5ML SUSPENSION    Take 30 mLs by mouth daily as needed for mild constipation.   METOPROLOL SUCCINATE (TOPROL-XL) 25 MG 24 HR TABLET    Take 25 mg by mouth daily.   MYRBETRIQ 50 MG TB24 TABLET    Take 50 mg by mouth daily.    OLMESARTAN (BENICAR) 40 MG TABLET    Take 40 mg by mouth daily.  Modified Medications   No medications on file  Discontinued Medications   No medications on file    Review of Systems  Unable to perform ROS: Dementia    Vitals:   06/19/16 1302  BP: 126/80  Pulse: 73  Resp: 17  Temp: 97.3 F (36.3 C)  SpO2: 98%  Weight: 142 lb 3.2 oz (64.5 kg)  Height: 4\' 10"  (1.473 m)   Body mass index is 29.72 kg/m.  Physical Exam  Constitutional: She appears well-developed. No distress.  Frail appearing in NAD  Musculoskeletal: She exhibits edema and deformity (thoracic kyphosis).  Neurological: She is alert.  Skin: Skin is warm and dry. Rash noted.  Angry appearing erythematous rash under R>L breast with satellite lesions noted. No vesicular formation. No secondary signs of infection.   Psychiatric: She has a normal mood and affect. Her behavior is normal.     Labs reviewed: Nursing Home on 06/07/2016  Component Date Value Ref Range Status  . Glucose 06/08/2016 87   Final  . BUN 06/08/2016 14  4 - 21 Final  . Creatinine 06/08/2016 0.8  0.5 - 1.1 Final  . Potassium 06/08/2016 4.0  3.4 - 5.3 Final  . Sodium 06/08/2016 137  137 - 147 Final  . Alkaline Phosphatase 06/08/2016 105  25 - 125 Final  . ALT 06/08/2016 8  7 - 35 Final  . AST 06/08/2016 15  13 - 35 Final  . Bilirubin, Total 06/08/2016 0.3   Final  . TSH 06/08/2016  4.37  0.41 - 5.90 Final    No results found.   Assessment/Plan   ICD-10-CM   1. Tinea B35.9    NEW     Start Nystatin powder - use under both breasts BID x 4 weeks or until rash resolves. Keep dry  Cont other meds as ordered  Will follow  Jaya Lapka S. Perlie Gold  [facility] [address] [place], [place] [address] [phone] Cell (Monday-Friday 8 AM -  5 PM) [phone] After 5 PM and follow prompts

## 2016-06-29 ENCOUNTER — Non-Acute Institutional Stay (SKILLED_NURSING_FACILITY): Payer: Medicare Other

## 2016-06-29 DIAGNOSIS — Z0000 Encounter for general adult medical examination without abnormal findings: Secondary | ICD-10-CM | POA: Diagnosis not present

## 2016-06-29 NOTE — Patient Instructions (Signed)
Cindy Hayes , Thank you for taking time to come for your Medicare Wellness Visit. I appreciate your ongoing commitment to your health goals. Please review the following plan we discussed and let me know if I can assist you in the future.   Screening recommendations/referrals: Colonoscopy up to date, long term pt Mammogram up to date, long term pt Bone Density up to date Recommended yearly ophthalmology/optometry visit for glaucoma screening and checkup Recommended yearly dental visit for hygiene and checkup  Vaccinations: Influenza vaccine due when available Pneumococcal vaccine 13 due, SNF declines Tdap vaccine due, SNF declines Shingles vaccine due, SNF declines  Advanced directives: DNR in chart  Conditions/risks identified: None  Next appointment: Dr. Eulas Post makes rounds   Preventive Care 82 Years and Older, Female Preventive care refers to lifestyle choices and visits with your health care provider that can promote health and wellness. What does preventive care include?  A yearly physical exam. This is also called an annual well check.  Dental exams once or twice a year.  Routine eye exams. Ask your health care provider how often you should have your eyes checked.  Personal lifestyle choices, including:  Daily care of your teeth and gums.  Regular physical activity.  Eating a healthy diet.  Avoiding tobacco and drug use.  Limiting alcohol use.  Practicing safe sex.  Taking low-dose aspirin every day.  Taking vitamin and mineral supplements as recommended by your health care provider. What happens during an annual well check? The services and screenings done by your health care provider during your annual well check will depend on your age, overall health, lifestyle risk factors, and family history of disease. Counseling  Your health care provider may ask you questions about your:  Alcohol use.  Tobacco use.  Drug use.  Emotional well-being.  Home  and relationship well-being.  Sexual activity.  Eating habits.  History of falls.  Memory and ability to understand (cognition).  Work and work Statistician.  Reproductive health. Screening  You may have the following tests or measurements:  Height, weight, and BMI.  Blood pressure.  Lipid and cholesterol levels. These may be checked every 5 years, or more frequently if you are over 79 years old.  Skin check.  Lung cancer screening. You may have this screening every year starting at age 45 if you have a 30-pack-year history of smoking and currently smoke or have quit within the past 15 years.  Fecal occult blood test (FOBT) of the stool. You may have this test every year starting at age 104.  Flexible sigmoidoscopy or colonoscopy. You may have a sigmoidoscopy every 5 years or a colonoscopy every 10 years starting at age 93.  Hepatitis C blood test.  Hepatitis B blood test.  Sexually transmitted disease (STD) testing.  Diabetes screening. This is done by checking your blood sugar (glucose) after you have not eaten for a while (fasting). You may have this done every 1-3 years.  Bone density scan. This is done to screen for osteoporosis. You may have this done starting at age 75.  Mammogram. This may be done every 1-2 years. Talk to your health care provider about how often you should have regular mammograms. Talk with your health care provider about your test results, treatment options, and if necessary, the need for more tests. Vaccines  Your health care provider may recommend certain vaccines, such as:  Influenza vaccine. This is recommended every year.  Tetanus, diphtheria, and acellular pertussis (Tdap, Td) vaccine. You may  need a Td booster every 10 years.  Zoster vaccine. You may need this after age 34.  Pneumococcal 13-valent conjugate (PCV13) vaccine. One dose is recommended after age 57.  Pneumococcal polysaccharide (PPSV23) vaccine. One dose is recommended  after age 21. Talk to your health care provider about which screenings and vaccines you need and how often you need them. This information is not intended to replace advice given to you by your health care provider. Make sure you discuss any questions you have with your health care provider. Document Released: 01/15/2015 Document Revised: 09/08/2015 Document Reviewed: 10/20/2014 Elsevier Interactive Patient Education  2017 [employer] Prevention in the Home Falls can cause injuries. They can happen to people of all ages. There are many things you can do to make your home safe and to help prevent falls. What can I do on the outside of my home?  Regularly fix the edges of walkways and driveways and fix any cracks.  Remove anything that might make you trip as you walk through a door, such as a raised step or threshold.  Trim any bushes or trees on the path to your home.  Use bright outdoor lighting.  Clear any walking paths of anything that might make someone trip, such as rocks or tools.  Regularly check to see if handrails are loose or broken. Make sure that both sides of any steps have handrails.  Any raised decks and porches should have guardrails on the edges.  Have any leaves, snow, or ice cleared regularly.  Use sand or salt on walking paths during winter.  Clean up any spills in your garage right away. This includes oil or grease spills. What can I do in the bathroom?  Use night lights.  Install grab bars by the toilet and in the tub and shower. Do not use towel bars as grab bars.  Use non-skid mats or decals in the tub or shower.  If you need to sit down in the shower, use a plastic, non-slip stool.  Keep the floor dry. Clean up any water that spills on the floor as soon as it happens.  Remove soap buildup in the tub or shower regularly.  Attach bath mats securely with double-sided non-slip rug tape.  Do not have throw rugs and other things on the floor  that can make you trip. What can I do in the bedroom?  Use night lights.  Make sure that you have a light by your bed that is easy to reach.  Do not use any sheets or blankets that are too big for your bed. They should not hang down onto the floor.  Have a firm chair that has side arms. You can use this for support while you get dressed.  Do not have throw rugs and other things on the floor that can make you trip. What can I do in the kitchen?  Clean up any spills right away.  Avoid walking on wet floors.  Keep items that you use a lot in easy-to-reach places.  If you need to reach something above you, use a strong step stool that has a grab bar.  Keep electrical cords out of the way.  Do not use floor polish or wax that makes floors slippery. If you must use wax, use non-skid floor wax.  Do not have throw rugs and other things on the floor that can make you trip. What can I do with my stairs?  Do not leave any items on  the stairs.  Make sure that there are handrails on both sides of the stairs and use them. Fix handrails that are broken or loose. Make sure that handrails are as long as the stairways.  Check any carpeting to make sure that it is firmly attached to the stairs. Fix any carpet that is loose or worn.  Avoid having throw rugs at the top or bottom of the stairs. If you do have throw rugs, attach them to the floor with carpet tape.  Make sure that you have a light switch at the top of the stairs and the bottom of the stairs. If you do not have them, ask someone to add them for you. What else can I do to help prevent falls?  Wear shoes that:  Do not have high heels.  Have rubber bottoms.  Are comfortable and fit you well.  Are closed at the toe. Do not wear sandals.  If you use a stepladder:  Make sure that it is fully opened. Do not climb a closed stepladder.  Make sure that both sides of the stepladder are locked into place.  Ask someone to hold it  for you, if possible.  Clearly mark and make sure that you can see:  Any grab bars or handrails.  First and last steps.  Where the edge of each step is.  Use tools that help you move around (mobility aids) if they are needed. These include:  Canes.  Walkers.  Scooters.  Crutches.  Turn on the lights when you go into a dark area. Replace any light bulbs as soon as they burn out.  Set up your furniture so you have a clear path. Avoid moving your furniture around.  If any of your floors are uneven, fix them.  If there are any pets around you, be aware of where they are.  Review your medicines with your doctor. Some medicines can make you feel dizzy. This can increase your chance of falling. Ask your doctor what other things that you can do to help prevent falls. This information is not intended to replace advice given to you by your health care provider. Make sure you discuss any questions you have with your health care provider. Document Released: 10/15/2008 Document Revised: 05/27/2015 Document Reviewed: 01/23/2014 Elsevier Interactive Patient Education  2017 [employer].

## 2016-06-29 NOTE — Progress Notes (Signed)
Subjective:   Cindy Hayes is a 83 y.o. female who presents for an Initial Medicare Annual Wellness Visit at [facility]       Objective:    Today's Vitals   06/29/16 1041  BP: 130/68  Pulse: 80  Temp: 98.2 F (36.8 C)  TempSrc: Oral  SpO2: 98%  Weight: 142 lb (64.4 kg)  Height: 4\' 10"  (1.473 m)   Body mass index is 29.68 kg/m.   Current Medications (verified) Outpatient Encounter Prescriptions as of 06/29/2016  Medication Sig  . amLODipine (NORVASC) 10 MG tablet Take 10 mg by mouth daily.  Marland Kitchen aspirin 81 MG tablet Take 81 mg by mouth daily.  Marland Kitchen docusate sodium (COLACE) 100 MG capsule Take 100 mg by mouth daily.  Marland Kitchen FLUTICASONE PROPIONATE, NASAL, NA Give 2 inhalations in both nostrils at bedtime  . latanoprost (XALATAN) 0.005 % ophthalmic solution Place 1 drop into both eyes at bedtime.  . magnesium hydroxide (MILK OF MAGNESIA) 400 MG/5ML suspension Take 30 mLs by mouth daily as needed for mild constipation.  . metoprolol succinate (TOPROL-XL) 25 MG 24 hr tablet Take 25 mg by mouth daily.  Marland Kitchen MYRBETRIQ 50 MG TB24 tablet Take 50 mg by mouth daily.   Marland Kitchen olmesartan (BENICAR) 40 MG tablet Take 40 mg by mouth daily.   No facility-administered encounter medications on file as of 06/29/2016.     Allergies (verified) Hctz [hydrochlorothiazide] and Penicillins   History: Past Medical History:  Diagnosis Date  . Dementia without behavioral disturbance 01/14/2015  . Diverticulosis   . Hard of hearing   . Hyperkalemia   . Hyperlipidemia   . Hypertension   . Memory loss   . Mitral regurgitation   . Onychomycosis of toenail   . Pedal edema    Past Surgical History:  Procedure Laterality Date  . anal stenosis surgery    . left renal artery stent  1990   Family History  Problem Relation Age of Onset  . Cancer Father   . Cancer Sister    Social History   Occupational History  . Not on file.   Social History Main Topics  . Smoking status: Former  Research scientist (life sciences)  . Smokeless tobacco: Never Used     Comment: "smoked for several years"  . Alcohol use No  . Drug use: No  . Sexual activity: Not on file    Tobacco Counseling Counseling given: Not Answered   Activities of Daily Living In your present state of health, do you have any difficulty performing the following activities: 06/29/2016  Hearing? Y  Vision? Y  Difficulty concentrating or making decisions? Y  Walking or climbing stairs? Y  Dressing or bathing? Y  Doing errands, shopping? Y  Preparing Food and eating ? Y  Using the Toilet? N  In the past six months, have you accidently leaked urine? Y  Do you have problems with loss of bowel control? N  Managing your Medications? Y  Managing your Finances? Y  Housekeeping or managing your Housekeeping? Y  Some recent data might be hidden    Immunizations and Health Maintenance Immunization History  Administered Date(s) Administered  . PPD Test 09/10/2015, 09/17/2015   There are no preventive care reminders to display for this patient.  Patient Care Team: Gildardo Cranker, DO as PCP - General (Internal Medicine) Nyoka Cowden Phylis Bougie, NP as Nurse Practitioner ([facility], [place] ([place])  Indicate any recent [place] you may have received from other  than [facility] providers in the past year (date may be approximate).     Assessment:   This is a routine wellness examination for Cindy Hayes.   Hearing/Vision screen No exam data present  Dietary issues and exercise activities discussed: Current Exercise Habits: The patient does not participate in regular exercise at present, Exercise limited by: orthopedic condition(s)  Goals    . Walking          Patient will walk when she has someone to walk with her in the facility.      Depression Screen PHQ 2/9 Scores 06/29/2016  PHQ - 2 Score 0    Fall Risk Fall Risk  06/29/2016  Falls in the past year? Yes  Number falls in past yr: 2  or more  Injury with Fall? No    Cognitive Function:     6CIT Screen 06/29/2016  What Year? 4 points  What month? 0 points  What time? 0 points  Count back from 20 0 points  Months in reverse 4 points  Repeat phrase 10 points  Total Score 18    Screening Tests Health Maintenance  Topic Date Due  . INFLUENZA VACCINE  03/15/2017 (Originally 08/02/2016)  . DEXA SCAN  03/15/2017 (Originally 08/09/1980)  . PNA vac Low Risk Adult (1 of 2 - PCV13) 03/15/2017 (Originally 08/09/1980)  . TETANUS/TDAP  03/09/2043 (Originally [birth year]/10/1932)      Plan:    I have personally reviewed and addressed the Medicare Annual Wellness questionnaire and have noted the following in the patient's chart:  A. Medical and social history B. Use of alcohol, tobacco or illicit drugs  C. Current medications and supplements D. Functional ability and status E.  Nutritional status F.  Physical activity G. Advance directives H. List of other physicians I.  Hospitalizations, surgeries, and ER visits in previous 12 months J.  [place] to include hearing, vision, cognitive, depression L. Referrals and appointments - none  In addition, I have reviewed and discussed with patient certain preventive protocols, quality metrics, and best practice recommendations. A written personalized care plan for preventive services as well as general preventive health recommendations were provided to patient.  See attached scanned questionnaire for additional information.   Signed,   Rich Reining, RN Nurse Health Advisor   Quick Notes   Health Maintenance: Flu due when available. PNA13 and TDAP due     Abnormal Screen: 6 CIT-18      Patient Concerns: None     Nurse Concerns: None

## 2016-07-07 ENCOUNTER — Encounter: Payer: Self-pay | Admitting: Adult Health

## 2016-07-07 ENCOUNTER — Non-Acute Institutional Stay (SKILLED_NURSING_FACILITY): Payer: Medicare Other | Admitting: Adult Health

## 2016-07-07 DIAGNOSIS — E785 Hyperlipidemia, unspecified: Secondary | ICD-10-CM

## 2016-07-07 DIAGNOSIS — M150 Primary generalized (osteo)arthritis: Secondary | ICD-10-CM

## 2016-07-07 DIAGNOSIS — N393 Stress incontinence (female) (male): Secondary | ICD-10-CM

## 2016-07-07 DIAGNOSIS — F0390 Unspecified dementia without behavioral disturbance: Secondary | ICD-10-CM

## 2016-07-07 DIAGNOSIS — M159 Polyosteoarthritis, unspecified: Secondary | ICD-10-CM

## 2016-07-07 DIAGNOSIS — E034 Atrophy of thyroid (acquired): Secondary | ICD-10-CM

## 2016-07-07 DIAGNOSIS — I10 Essential (primary) hypertension: Secondary | ICD-10-CM

## 2016-07-07 NOTE — Progress Notes (Signed)
Provider:  Ok Edwards, NP Location:  [place] Room Number: 110 B Place of Service:  SNF (31)   PCP: Gildardo Cranker, DO Patient Care Team: Gildardo Cranker, DO as PCP - General (Internal Medicine) Nyoka Cowden Phylis Bougie, NP as Nurse Practitioner (Geriatric Medicine) [facility], [place] ([place])  Extended Emergency Contact Information Primary Emergency Contact: [place] Address: [address]          [place] [address] Johnnette Litter of [place] Phone: [phone] Relation: Son Secondary Emergency Contact: Rennis Chris [place] of [place] Phone: [phone] Relation: Other  Code Status: DNR Goals of Care: Advanced Directive information Advanced Directives 07/07/2016  Does Patient Have a Medical Advance Directive? Yes  Type of Advance Directive Out of facility DNR (pink MOST or yellow form)  Does patient want to make changes to medical advance directive? No - Patient declined  Copy of [place] in Chart? -  Would patient like information on creating a medical advance directive? -  Pre-existing out of facility DNR order (yellow form or pink MOST form) Yellow form placed in chart (order not valid for inpatient use);Pink MOST form placed in chart (order not valid for inpatient use)      Allergies  Allergen Reactions  . Hctz [Hydrochlorothiazide]   . Penicillins Hives    Has patient had a PCN reaction causing immediate rash, facial/tongue/throat swelling, SOB or lightheadedness with hypotension: No Has patient had a PCN reaction causing severe rash involving mucus membranes or skin necrosis: No (pt had hives and itchy rash)  Has patient had a PCN reaction that required hospitalization: No Has patient had a PCN reaction occurring within the last 10 years: No If all of the above answers are "NO", then may proceed with Cephalosporin use.      Chief Complaint  Patient presents with  . Annual Exam   Yearly exam    HPI: Patient is a 82 y.o. female seen today for an annual comprehensive examination. She is doing remarkably well. She has not required any hospitalizations over the past year. She tells me that she is feeling good. She continues to ambulate behind a wheelchair daily. There are no nursing concerns at this time.    Past Medical History:  Diagnosis Date  . Dementia without behavioral disturbance 01/14/2015  . Diverticulosis   . Hard of hearing   . Hyperkalemia   . Hyperlipidemia   . Hypertension   . Memory loss   . Mitral regurgitation   . Onychomycosis of toenail   . Pedal edema    Past Surgical History:  Procedure Laterality Date  . anal stenosis surgery    . left renal artery stent  1990    reports that she has quit smoking. She has never used smokeless tobacco. She reports that she does not drink alcohol or use drugs. Social History   Social History  . Marital status: Divorced    Spouse name: N/A  . Number of children: N/A  . Years of education: N/A   Occupational History  . Not on file.   Social History Main Topics  . Smoking status: Former Research scientist (life sciences)  . Smokeless tobacco: Never Used     Comment: "smoked for several years"  . Alcohol use No  . Drug use: No  . Sexual activity: Not on file   Other Topics Concern  . Not on file   Social History Narrative  . No narrative on file   Family History  Problem Relation  Age of Onset  . Cancer Father   . Cancer Sister     Vitals:   07/07/16 1005  BP: 136/76  Pulse: 70  Weight: 139 lb 3.2 oz (63.1 kg)  Height: 4\' 10"  (1.473 m)   Body mass index is 29.09 kg/m.  Outpatient Encounter Prescriptions as of 07/07/2016  Medication Sig  . amLODipine (NORVASC) 10 MG tablet Take 5 mg by mouth daily.  Marland Kitchen aspirin 81 MG tablet Take 81 mg by mouth daily.  Marland Kitchen docusate sodium (COLACE) 100 MG capsule Take 100 mg by mouth at bedtime.   Marland Kitchen latanoprost (XALATAN) 0.005 % ophthalmic solution Place 1 drop into both eyes at  bedtime.  . magnesium hydroxide (MILK OF MAGNESIA) 400 MG/5ML suspension Take 30 mLs by mouth daily as needed for mild constipation.  . metoprolol succinate (TOPROL-XL) 25 MG 24 hr tablet Take 25 mg by mouth daily.  Marland Kitchen MYRBETRIQ 50 MG TB24 tablet Take 50 mg by mouth daily.   Marland Kitchen nystatin (NYSTATIN) powder Apply to bilateral breast topically two times a day for rash x 4 weeks  . olmesartan (BENICAR) 40 MG tablet Take 40 mg by mouth daily.  . [DISCONTINUED] FLUTICASONE PROPIONATE, NASAL, NA Give 2 inhalations in both nostrils at bedtime   No facility-administered encounter medications on file as of 07/07/2016.      SIGNIFICANT DIAGNOSTIC EXAMS  12-24-14: left wrist x-ray: 1. Displaced distal radius fracture as described above. 2. Probable nondisplaced ulnar styloid fracture.   12-24-14: left forearm x-ray: Fractures of the distal left radius and ulnar styloid.   LABS REVIEWED:   09-09-15; glucose 114; bun 14.5; creat 0.85; k+ 4.2; na++ 132; liver normal albumin 4.3; tsh 4.64  03-23-16: wbc 8.1; hgb 12.8; hct 37.7; mcv 89.1 plt 267; glucose 98; bun 13.5; create 0.81; k+ 4.1; na++ 134; ca 9.2  06-08-16: glucose 87; bun 13.7; creat 0.76; k+ 4.0; na++137; liver normal albumin 3.8  tsh 4.37    Review of Systems  Constitutional: Negative for malaise/fatigue.  HENT:       Hard of hearing   Eyes: no complaints  Respiratory: Negative for  shortness of breath.   Cardiovascular: Negative for chest pain, palpitations and leg swelling.  Gastrointestinal: Negative for abdominal pain, constipation and heartburn.  Musculoskeletal: Negative for back pain, joint pain and myalgias.  Skin: Negative.   Neurological: Negative for dizziness.  Psychiatric/Behavioral: The patient is not nervous/anxious.       Physical Exam  Constitutional: No distress.  Eyes: conjunctiva without redness present.     Neck: Neck supple. No JVD present. No thyromegaly present.  Cardiovascular: Normal rate, regular rhythm and  intact distal pulses.   Respiratory: Effort normal and breath sounds normal. No respiratory distress. She has no wheezes.  GI: Soft. Bowel sounds are normal. She exhibits no distension. There is no tenderness.  Musculoskeletal: She exhibits 1-2+ lower extremity edema  Has kyphosis   Able to move all extremities  Ambulates throughout facility behind wheelchair  Lymphadenopathy:    She has no cervical adenopathy.  Neurological: She is alert.  Skin: Skin is warm and dry. She is not diaphoretic.  Psychiatric: She has a normal mood and affect.    ASSESSMENT/ PLAN:  1.  Hypertension:b/p: 136/76  will continue norvasc 10 mg daily benicar 40  mg daily; will continue toprol xl 25 mg daily and asa 81 mg daily   2. Dyslipidemia: is presently not on medications; will monitor   3. Hypothyroidism: is presently not on medications;  will monitor tsh is 4.64   4. Urinary incontinence: will continue myrbetriq 50 mg daily;   5. Dementia: no significnat change in her status; is presently not on medications; will monitor her weight is stable at 139 pounds   6. Osteoarthritis: is presently no complaint of pain present; will monitor    Her health care is up to date  Time spent with patient 45   minutes >50% time spent counseling; reviewing medical record; tests; labs; and developing future plan of care   MD is aware of resident's narcotic use and is in agreement with current plan of care. We will wean dosage as appropriate for resident     Ok Edwards NP [facility]  Contact [phone] Monday through Friday 8am- 5pm  After hours call [phone]

## 2016-08-04 DIAGNOSIS — R739 Hyperglycemia, unspecified: Secondary | ICD-10-CM | POA: Diagnosis not present

## 2016-08-08 ENCOUNTER — Non-Acute Institutional Stay (SKILLED_NURSING_FACILITY): Payer: Medicare Other | Admitting: Adult Health

## 2016-08-08 ENCOUNTER — Encounter: Payer: Self-pay | Admitting: Adult Health

## 2016-08-08 DIAGNOSIS — N393 Stress incontinence (female) (male): Secondary | ICD-10-CM

## 2016-08-08 DIAGNOSIS — F0390 Unspecified dementia without behavioral disturbance: Secondary | ICD-10-CM

## 2016-08-08 DIAGNOSIS — M150 Primary generalized (osteo)arthritis: Secondary | ICD-10-CM | POA: Diagnosis not present

## 2016-08-08 DIAGNOSIS — E034 Atrophy of thyroid (acquired): Secondary | ICD-10-CM

## 2016-08-08 DIAGNOSIS — I10 Essential (primary) hypertension: Secondary | ICD-10-CM | POA: Diagnosis not present

## 2016-08-08 DIAGNOSIS — M159 Polyosteoarthritis, unspecified: Secondary | ICD-10-CM

## 2016-08-08 DIAGNOSIS — R739 Hyperglycemia, unspecified: Secondary | ICD-10-CM | POA: Diagnosis not present

## 2016-08-08 DIAGNOSIS — E785 Hyperlipidemia, unspecified: Secondary | ICD-10-CM

## 2016-08-08 NOTE — Progress Notes (Signed)
Location:   [place] Room Number: 110 B Place of Service:  SNF (31)   CODE STATUS: DNR  Allergies  Allergen Reactions  . Hctz [Hydrochlorothiazide]   . Penicillins Hives    Has patient had a PCN reaction causing immediate rash, facial/tongue/throat swelling, SOB or lightheadedness with hypotension: No Has patient had a PCN reaction causing severe rash involving mucus membranes or skin necrosis: No (pt had hives and itchy rash)  Has patient had a PCN reaction that required hospitalization: No Has patient had a PCN reaction occurring within the last 10 years: No If all of the above answers are "NO", then may proceed with Cephalosporin use.     Chief Complaint  Patient presents with  . Medical Management of Chronic Issues    1 month follow up    HPI:  She is a 83 year old long term resident of this facility being seen for the management of her chronic illnesses: hypertension; osteoarthritis; urinary incontinence; dementia; hypothyroidism; dyslipidemia . Overall she is doing well. She continues to ambulate behind her wheelchair without difficulty. She tells me that she is feeling good. There are no nursing concerns at this time.    Past Medical History:  Diagnosis Date  . Dementia without behavioral disturbance 01/14/2015  . Diverticulosis   . Hard of hearing   . Hyperkalemia   . Hyperlipidemia   . Hypertension   . Memory loss   . Mitral regurgitation   . Onychomycosis of toenail   . Pedal edema     Past Surgical History:  Procedure Laterality Date  . anal stenosis surgery    . left renal artery stent  1990    Social History   Social History  . Marital status: Divorced    Spouse name: N/A  . Number of children: N/A  . Years of education: N/A   Occupational History  . Not on file.   Social History Main Topics  . Smoking status: Former Research scientist (life sciences)  . Smokeless tobacco: Never Used     Comment: "smoked for several years"  . Alcohol use No  . Drug  use: No  . Sexual activity: Not on file   Other Topics Concern  . Not on file   Social History Narrative  . No narrative on file   Family History  Problem Relation Age of Onset  . Cancer Father   . Cancer Sister       VITAL SIGNS BP (!) 142/78   Pulse 74   Temp 97.6 F (36.4 C)   Resp 18   Ht 4\' 10"  (1.473 m)   Wt 139 lb 9.6 oz (63.3 kg)   SpO2 96%   BMI 29.18 kg/m   Patient's Medications  New Prescriptions   No medications on file  Previous Medications   AMLODIPINE (NORVASC) 5 MG TABLET    Take 5 mg by mouth daily.   ASPIRIN 81 MG TABLET    Take 81 mg by mouth daily.   DOCUSATE SODIUM (COLACE) 100 MG CAPSULE    Take 100 mg by mouth at bedtime.    LATANOPROST (XALATAN) 0.005 % OPHTHALMIC SOLUTION    Place 1 drop into both eyes at bedtime.   MAGNESIUM HYDROXIDE (MILK OF MAGNESIA) 400 MG/5ML SUSPENSION    Take 30 mLs by mouth daily as needed for mild constipation.   METOPROLOL SUCCINATE (TOPROL-XL) 25 MG 24 HR TABLET    Take 25 mg by mouth daily.   MYRBETRIQ 50 MG TB24 TABLET  Take 50 mg by mouth daily.    OLMESARTAN (BENICAR) 40 MG TABLET    Take 40 mg by mouth daily.  Modified Medications   No medications on file  Discontinued Medications   No medications on file     SIGNIFICANT DIAGNOSTIC EXAMS  PREVIOUS   12-24-14: left wrist x-ray: 1. Displaced distal radius fracture as described above. 2. Probable nondisplaced ulnar styloid fracture.   12-24-14: left forearm x-ray: Fractures of the distal left radius and ulnar styloid.  NO NEW EXAMS    LABS REVIEWED: PREVIOUS    09-09-15; glucose 114; bun 14.5; creat 0.85; k+ 4.2; na++ 132; liver normal albumin 4.3; tsh 4.64  03-23-16: wbc 8.1; hgb 12.8; hct 37.7; mcv 89.1 plt 267; glucose 98; bun 13.5; create 0.81; k+ 4.1; na++ 134; ca 9.2  06-08-16: glucose 87; bun 13.7; creat 0.76; k+ 4.0; na++137; liver normal albumin 3.8  tsh 4.37  NO NEW LABS  Review of Systems  Constitutional: Negative for malaise/fatigue.    HENT: Positive for hearing loss.   Respiratory: Negative for cough and shortness of breath.   Cardiovascular: Negative for chest pain, palpitations and leg swelling.  Gastrointestinal: Negative for abdominal pain, constipation and heartburn.  Musculoskeletal: Negative for back pain, joint pain and myalgias.  Skin: Negative.   Neurological: Negative for dizziness.  Psychiatric/Behavioral: The patient is not nervous/anxious.    Physical Exam  Constitutional: She is oriented to person, place, and time. No distress.  Eyes: Conjunctivae are normal.  Neck: Neck supple. No JVD present. No thyromegaly present.  Cardiovascular: Normal rate, regular rhythm and intact distal pulses.   Respiratory: Effort normal and breath sounds normal. No respiratory distress. She has no wheezes.  GI: Soft. Bowel sounds are normal. She exhibits no distension. There is no tenderness.  Musculoskeletal: She exhibits edema.  1-2+ lower extremity edema  Has kyphosis   Able to move all extremities  Ambulates throughout facility behind wheelchair   Lymphadenopathy:    She has no cervical adenopathy.  Neurological: She is alert and oriented to person, place, and time.  Skin: Skin is warm and dry. She is not diaphoretic.  Psychiatric: She has a normal mood and affect.     ASSESSMENT/ PLAN:  TODAY:   1.  Hypertension:b/p: 124/78: is stable   will continue norvasc 10 mg daily benicar 40  mg daily; will continue toprol xl 25 mg daily and asa 81 mg daily   2. Dyslipidemia: is stable  is presently not on medications; will monitor   3. Hypothyroidism: stable  is presently not on medications; will monitor tsh is 4.64   4. Urinary incontinence: stable  will continue myrbetriq 50 mg daily;   5. Dementia: no significnat change in her status; is presently not on medications; will monitor her weight is stable at 139 pounds   6. Osteoarthritis:  Is stable is presently no complaint of pain present; will monitor      Ok Edwards NP [facility]  Contact [phone] Monday through Friday 8am- 5pm  After hours call [phone]

## 2016-08-09 DIAGNOSIS — R739 Hyperglycemia, unspecified: Secondary | ICD-10-CM | POA: Diagnosis not present

## 2016-08-10 DIAGNOSIS — R739 Hyperglycemia, unspecified: Secondary | ICD-10-CM | POA: Diagnosis not present

## 2016-08-11 DIAGNOSIS — R739 Hyperglycemia, unspecified: Secondary | ICD-10-CM | POA: Diagnosis not present

## 2016-08-11 IMAGING — CR DG WRIST COMPLETE 3+V*L*
3 series · 3 of 3 positions shown · non-contrast
Comparison: None.

CLINICAL DATA: Fall from bed with wrist deformity. Initial
encounter.

EXAM:
LEFT WRIST - COMPLETE 3+ VIEW

[x wrist lat left]
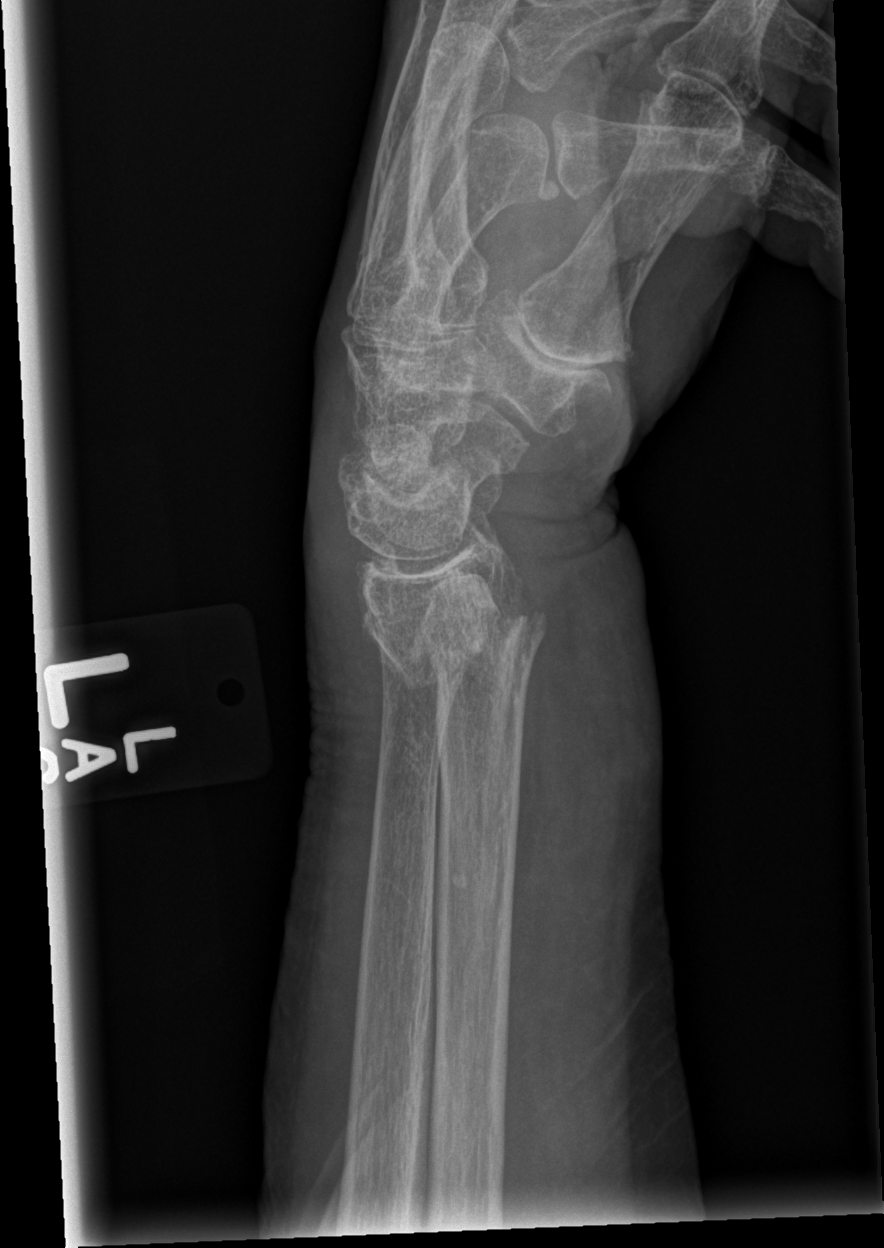

[x wrist pa left]
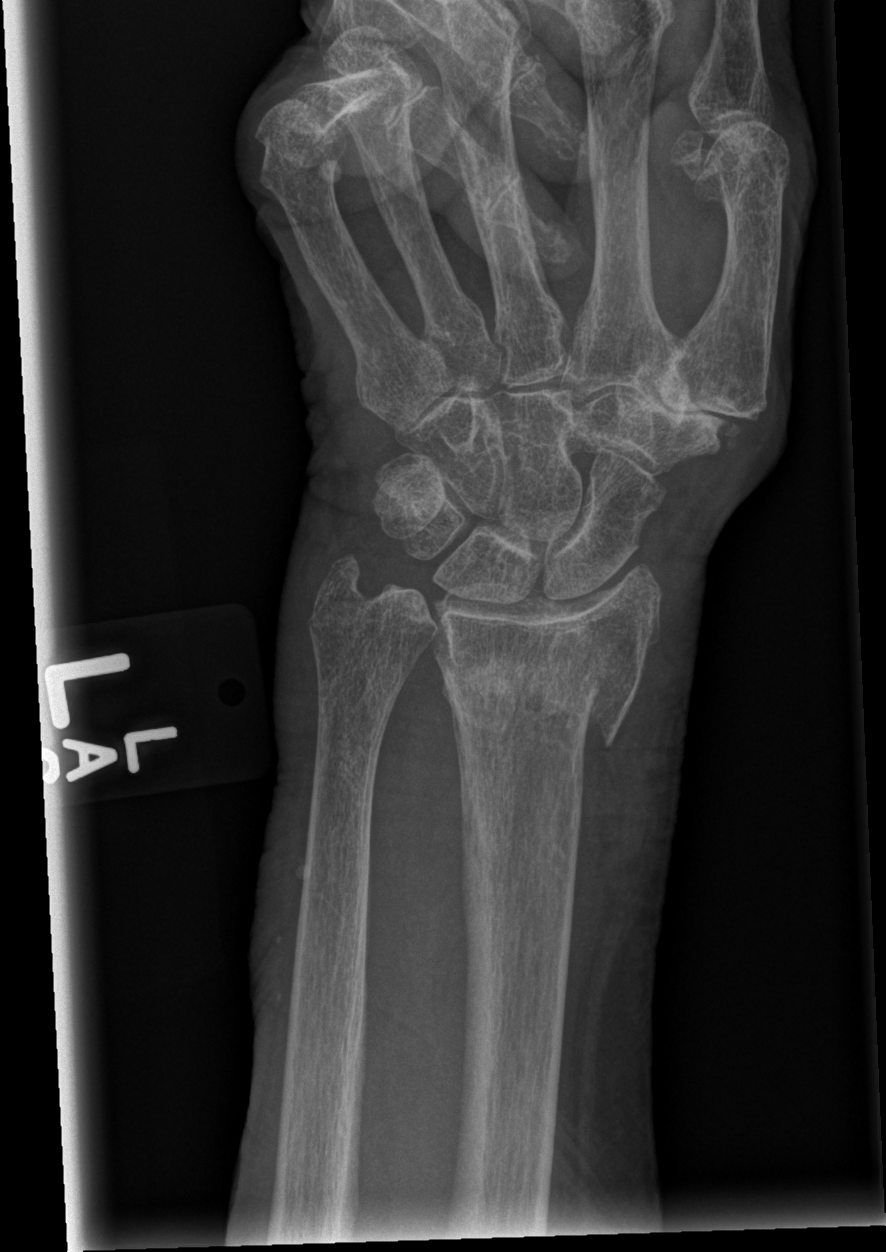

[x wrist obl left]
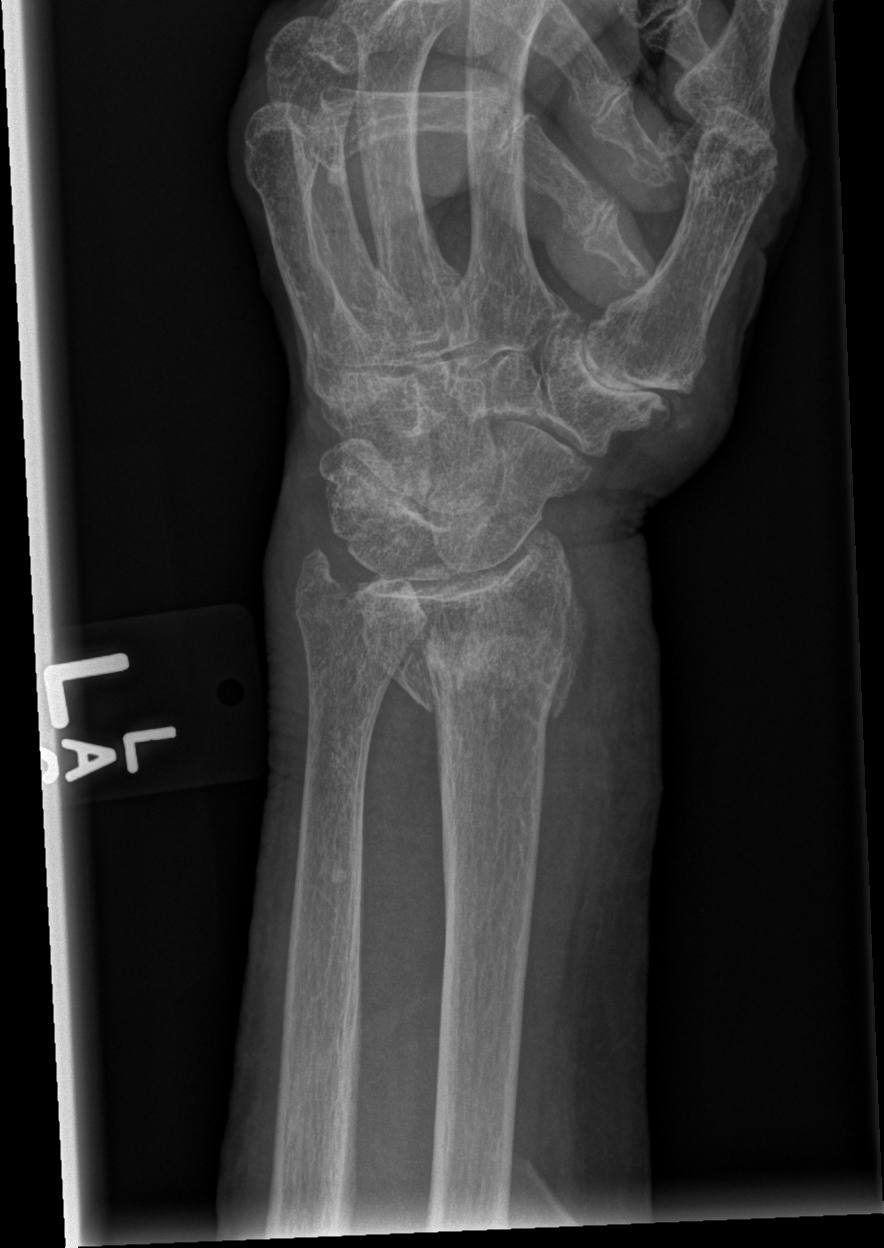

[3 of 3 positions shown; findings below may reference images not displayed]

FINDINGS: Acute transverse extra-articular fracture through the distal radial
metaphysis with 50% posterior displacement and dorsal wrist tilting.
Mild acquired ulnar positive variance. Probable nondisplaced
fracture through the base of the ulnar styloid.

Carpal alignment is normal.

Profound osteopenia.
IMPRESSION: 1. Displaced distal radius fracture as described above.
2. Probable nondisplaced ulnar styloid fracture.

## 2016-08-14 DIAGNOSIS — R739 Hyperglycemia, unspecified: Secondary | ICD-10-CM | POA: Diagnosis not present

## 2016-08-15 DIAGNOSIS — L603 Nail dystrophy: Secondary | ICD-10-CM | POA: Diagnosis not present

## 2016-08-15 DIAGNOSIS — L84 Corns and callosities: Secondary | ICD-10-CM | POA: Diagnosis not present

## 2016-08-15 DIAGNOSIS — R600 Localized edema: Secondary | ICD-10-CM | POA: Diagnosis not present

## 2016-08-15 DIAGNOSIS — B351 Tinea unguium: Secondary | ICD-10-CM | POA: Diagnosis not present

## 2016-08-15 DIAGNOSIS — I739 Peripheral vascular disease, unspecified: Secondary | ICD-10-CM | POA: Diagnosis not present

## 2016-08-15 DIAGNOSIS — R739 Hyperglycemia, unspecified: Secondary | ICD-10-CM | POA: Diagnosis not present

## 2016-08-16 DIAGNOSIS — R739 Hyperglycemia, unspecified: Secondary | ICD-10-CM | POA: Diagnosis not present

## 2016-08-17 DIAGNOSIS — R739 Hyperglycemia, unspecified: Secondary | ICD-10-CM | POA: Diagnosis not present

## 2016-08-18 DIAGNOSIS — R739 Hyperglycemia, unspecified: Secondary | ICD-10-CM | POA: Diagnosis not present

## 2016-08-21 DIAGNOSIS — R739 Hyperglycemia, unspecified: Secondary | ICD-10-CM | POA: Diagnosis not present

## 2016-08-22 DIAGNOSIS — R739 Hyperglycemia, unspecified: Secondary | ICD-10-CM | POA: Diagnosis not present

## 2016-08-23 DIAGNOSIS — D4981 Neoplasm of unspecified behavior of retina and choroid: Secondary | ICD-10-CM | POA: Diagnosis not present

## 2016-08-23 DIAGNOSIS — R739 Hyperglycemia, unspecified: Secondary | ICD-10-CM | POA: Diagnosis not present

## 2016-08-23 DIAGNOSIS — H401134 Primary open-angle glaucoma, bilateral, indeterminate stage: Secondary | ICD-10-CM | POA: Diagnosis not present

## 2016-08-23 DIAGNOSIS — Z7951 Long term (current) use of inhaled steroids: Secondary | ICD-10-CM | POA: Diagnosis not present

## 2016-08-23 DIAGNOSIS — H2511 Age-related nuclear cataract, right eye: Secondary | ICD-10-CM | POA: Diagnosis not present

## 2016-08-24 DIAGNOSIS — R739 Hyperglycemia, unspecified: Secondary | ICD-10-CM | POA: Diagnosis not present

## 2016-08-25 DIAGNOSIS — R739 Hyperglycemia, unspecified: Secondary | ICD-10-CM | POA: Diagnosis not present

## 2016-08-28 DIAGNOSIS — R739 Hyperglycemia, unspecified: Secondary | ICD-10-CM | POA: Diagnosis not present

## 2016-08-29 DIAGNOSIS — R739 Hyperglycemia, unspecified: Secondary | ICD-10-CM | POA: Diagnosis not present

## 2016-08-30 DIAGNOSIS — R739 Hyperglycemia, unspecified: Secondary | ICD-10-CM | POA: Diagnosis not present

## 2016-08-31 DIAGNOSIS — R739 Hyperglycemia, unspecified: Secondary | ICD-10-CM | POA: Diagnosis not present

## 2016-09-01 DIAGNOSIS — R739 Hyperglycemia, unspecified: Secondary | ICD-10-CM | POA: Diagnosis not present

## 2016-09-04 DIAGNOSIS — H42 Glaucoma in diseases classified elsewhere: Secondary | ICD-10-CM | POA: Diagnosis not present

## 2016-09-04 DIAGNOSIS — I10 Essential (primary) hypertension: Secondary | ICD-10-CM | POA: Diagnosis not present

## 2016-09-04 DIAGNOSIS — K5790 Diverticulosis of intestine, part unspecified, without perforation or abscess without bleeding: Secondary | ICD-10-CM | POA: Diagnosis not present

## 2016-09-04 DIAGNOSIS — E785 Hyperlipidemia, unspecified: Secondary | ICD-10-CM | POA: Diagnosis not present

## 2016-09-04 DIAGNOSIS — E039 Hypothyroidism, unspecified: Secondary | ICD-10-CM | POA: Diagnosis not present

## 2016-09-04 DIAGNOSIS — R739 Hyperglycemia, unspecified: Secondary | ICD-10-CM | POA: Diagnosis not present

## 2016-09-04 DIAGNOSIS — M1990 Unspecified osteoarthritis, unspecified site: Secondary | ICD-10-CM | POA: Diagnosis not present

## 2016-09-05 DIAGNOSIS — E785 Hyperlipidemia, unspecified: Secondary | ICD-10-CM | POA: Diagnosis not present

## 2016-09-05 DIAGNOSIS — K5790 Diverticulosis of intestine, part unspecified, without perforation or abscess without bleeding: Secondary | ICD-10-CM | POA: Diagnosis not present

## 2016-09-05 DIAGNOSIS — R739 Hyperglycemia, unspecified: Secondary | ICD-10-CM | POA: Diagnosis not present

## 2016-09-05 DIAGNOSIS — E039 Hypothyroidism, unspecified: Secondary | ICD-10-CM | POA: Diagnosis not present

## 2016-09-05 DIAGNOSIS — I10 Essential (primary) hypertension: Secondary | ICD-10-CM | POA: Diagnosis not present

## 2016-09-05 DIAGNOSIS — H42 Glaucoma in diseases classified elsewhere: Secondary | ICD-10-CM | POA: Diagnosis not present

## 2016-09-05 DIAGNOSIS — M1990 Unspecified osteoarthritis, unspecified site: Secondary | ICD-10-CM | POA: Diagnosis not present

## 2016-09-06 DIAGNOSIS — E039 Hypothyroidism, unspecified: Secondary | ICD-10-CM | POA: Diagnosis not present

## 2016-09-06 DIAGNOSIS — K5790 Diverticulosis of intestine, part unspecified, without perforation or abscess without bleeding: Secondary | ICD-10-CM | POA: Diagnosis not present

## 2016-09-06 DIAGNOSIS — M1990 Unspecified osteoarthritis, unspecified site: Secondary | ICD-10-CM | POA: Diagnosis not present

## 2016-09-06 DIAGNOSIS — R739 Hyperglycemia, unspecified: Secondary | ICD-10-CM | POA: Diagnosis not present

## 2016-09-06 DIAGNOSIS — H42 Glaucoma in diseases classified elsewhere: Secondary | ICD-10-CM | POA: Diagnosis not present

## 2016-09-06 DIAGNOSIS — I10 Essential (primary) hypertension: Secondary | ICD-10-CM | POA: Diagnosis not present

## 2016-09-06 DIAGNOSIS — E785 Hyperlipidemia, unspecified: Secondary | ICD-10-CM | POA: Diagnosis not present

## 2016-09-07 DIAGNOSIS — K5790 Diverticulosis of intestine, part unspecified, without perforation or abscess without bleeding: Secondary | ICD-10-CM | POA: Diagnosis not present

## 2016-09-07 DIAGNOSIS — H42 Glaucoma in diseases classified elsewhere: Secondary | ICD-10-CM | POA: Diagnosis not present

## 2016-09-07 DIAGNOSIS — E785 Hyperlipidemia, unspecified: Secondary | ICD-10-CM | POA: Diagnosis not present

## 2016-09-07 DIAGNOSIS — R739 Hyperglycemia, unspecified: Secondary | ICD-10-CM | POA: Diagnosis not present

## 2016-09-07 DIAGNOSIS — I10 Essential (primary) hypertension: Secondary | ICD-10-CM | POA: Diagnosis not present

## 2016-09-07 DIAGNOSIS — M1990 Unspecified osteoarthritis, unspecified site: Secondary | ICD-10-CM | POA: Diagnosis not present

## 2016-09-07 DIAGNOSIS — E039 Hypothyroidism, unspecified: Secondary | ICD-10-CM | POA: Diagnosis not present

## 2016-09-08 ENCOUNTER — Non-Acute Institutional Stay (SKILLED_NURSING_FACILITY): Payer: Medicare Other | Admitting: Adult Health

## 2016-09-08 ENCOUNTER — Encounter: Payer: Self-pay | Admitting: Adult Health

## 2016-09-08 DIAGNOSIS — E034 Atrophy of thyroid (acquired): Secondary | ICD-10-CM | POA: Diagnosis not present

## 2016-09-08 DIAGNOSIS — N393 Stress incontinence (female) (male): Secondary | ICD-10-CM

## 2016-09-08 DIAGNOSIS — M150 Primary generalized (osteo)arthritis: Secondary | ICD-10-CM

## 2016-09-08 DIAGNOSIS — M159 Polyosteoarthritis, unspecified: Secondary | ICD-10-CM

## 2016-09-08 DIAGNOSIS — K5790 Diverticulosis of intestine, part unspecified, without perforation or abscess without bleeding: Secondary | ICD-10-CM | POA: Diagnosis not present

## 2016-09-08 DIAGNOSIS — E039 Hypothyroidism, unspecified: Secondary | ICD-10-CM | POA: Diagnosis not present

## 2016-09-08 DIAGNOSIS — F0390 Unspecified dementia without behavioral disturbance: Secondary | ICD-10-CM | POA: Diagnosis not present

## 2016-09-08 DIAGNOSIS — M1990 Unspecified osteoarthritis, unspecified site: Secondary | ICD-10-CM | POA: Diagnosis not present

## 2016-09-08 DIAGNOSIS — I10 Essential (primary) hypertension: Secondary | ICD-10-CM | POA: Diagnosis not present

## 2016-09-08 DIAGNOSIS — R739 Hyperglycemia, unspecified: Secondary | ICD-10-CM | POA: Diagnosis not present

## 2016-09-08 DIAGNOSIS — E785 Hyperlipidemia, unspecified: Secondary | ICD-10-CM

## 2016-09-08 DIAGNOSIS — H42 Glaucoma in diseases classified elsewhere: Secondary | ICD-10-CM | POA: Diagnosis not present

## 2016-09-08 NOTE — Progress Notes (Signed)
Location:   [place] Room Number: 110 B Place of Service:  SNF (31)   CODE STATUS: DNR  Allergies  Allergen Reactions  . Hctz [Hydrochlorothiazide]   . Penicillins Hives    Has patient had a PCN reaction causing immediate rash, facial/tongue/throat swelling, SOB or lightheadedness with hypotension: No Has patient had a PCN reaction causing severe rash involving mucus membranes or skin necrosis: No (pt had hives and itchy rash)  Has patient had a PCN reaction that required hospitalization: No Has patient had a PCN reaction occurring within the last 10 years: No If all of the above answers are "NO", then may proceed with Cephalosporin use.     Chief Complaint  Patient presents with  . Medical Management of Chronic Issues    dyslipidemia; hypothyroidism; UI; dementia; osteoarthritis    HPI:  She is a 83 year old long term resident of this facility being seen for the management of her chronic illnesses: dyslipidemia; hypothyroidism; UI; dementia; and osteoarthritis. She denies pain; no complaints of fatigue or excessive dry skin. She continues to ambulate behind a wheelchair. There are no nursing concerns at this time.   Past Medical History:  Diagnosis Date  . Dementia without behavioral disturbance 01/14/2015  . Diverticulosis   . Hard of hearing   . Hyperkalemia   . Hyperlipidemia   . Hypertension   . Memory loss   . Mitral regurgitation   . Onychomycosis of toenail   . Pedal edema     Past Surgical History:  Procedure Laterality Date  . anal stenosis surgery    . left renal artery stent  1990    Social History   Social History  . Marital status: Divorced    Spouse name: N/A  . Number of children: N/A  . Years of education: N/A   Occupational History  . Not on file.   Social History Main Topics  . Smoking status: Former Research scientist (life sciences)  . Smokeless tobacco: Never Used     Comment: "smoked for several years"  . Alcohol use No  . Drug use: No  .  Sexual activity: Not on file   Other Topics Concern  . Not on file   Social History Narrative  . No narrative on file   Family History  Problem Relation Age of Onset  . Cancer Father   . Cancer Sister       VITAL SIGNS BP 125/72   Pulse 72   Temp (!) 97 F (36.1 C)   Resp 18   Ht 4\' 10"  (1.473 m)   Wt 139 lb 9.6 oz (63.3 kg)   SpO2 95%   BMI 29.18 kg/m   Patient's Medications  New Prescriptions   No medications on file  Previous Medications   AMLODIPINE (NORVASC) 5 MG TABLET    Take 5 mg by mouth daily.   ASPIRIN 81 MG TABLET    Take 81 mg by mouth daily.   DOCUSATE SODIUM (COLACE) 100 MG CAPSULE    Take 100 mg by mouth at bedtime.    LATANOPROST (XALATAN) 0.005 % OPHTHALMIC SOLUTION    Place 1 drop into both eyes at bedtime.   METOPROLOL SUCCINATE (TOPROL-XL) 25 MG 24 HR TABLET    Take 25 mg by mouth daily.   MYRBETRIQ 50 MG TB24 TABLET    Take 50 mg by mouth daily.    OLMESARTAN (BENICAR) 40 MG TABLET    Take 40 mg by mouth daily.  Modified Medications  No medications on file  Discontinued Medications   MAGNESIUM HYDROXIDE (MILK OF MAGNESIA) 400 MG/5ML SUSPENSION    Take 30 mLs by mouth daily as needed for mild constipation.     SIGNIFICANT DIAGNOSTIC EXAMS  PREVIOUS   12-24-14: left wrist x-ray: 1. Displaced distal radius fracture as described above. 2. Probable nondisplaced ulnar styloid fracture.   12-24-14: left forearm x-ray: Fractures of the distal left radius and ulnar styloid.  NO NEW EXAMS    LABS REVIEWED: PREVIOUS    09-09-15; glucose 114; bun 14.5; creat 0.85; k+ 4.2; na++ 132; liver normal albumin 4.3; tsh 4.64  03-23-16: wbc 8.1; hgb 12.8; hct 37.7; mcv 89.1 plt 267; glucose 98; bun 13.5; create 0.81; k+ 4.1; na++ 134; ca 9.2  06-08-16: glucose 87; bun 13.7; creat 0.76; k+ 4.0; na++137; liver normal albumin 3.8  tsh 4.37  NO NEW LABS  Review of Systems  Constitutional: Negative for malaise/fatigue.  HENT: Positive for hearing loss.     Respiratory: Negative for cough and shortness of breath.   Cardiovascular: Negative for chest pain, palpitations and leg swelling.  Gastrointestinal: Negative for abdominal pain, constipation and heartburn.  Musculoskeletal: Negative for back pain, joint pain and myalgias.  Skin: Negative.   Neurological: Negative for dizziness.  Psychiatric/Behavioral: The patient is not nervous/anxious.     Physical Exam  Constitutional: She is oriented to person, place, and time. No distress.  Frail   Eyes: Conjunctivae are normal.  Neck: Neck supple. No JVD present. No thyromegaly present.  Cardiovascular: Normal rate, regular rhythm and intact distal pulses.   Respiratory: Effort normal and breath sounds normal. No respiratory distress. She has no wheezes.  GI: Soft. Bowel sounds are normal. She exhibits no distension. There is no tenderness.  Musculoskeletal: She exhibits edema.  Able to move all extremities  1+ lower extremity edema Does ambulate behind wheelchair Has kyphosis   Lymphadenopathy:    She has no cervical adenopathy.  Neurological: She is alert and oriented to person, place, and time.  Skin: Skin is warm and dry. She is not diaphoretic.  Psychiatric: She has a normal mood and affect.    ASSESSMENT/ PLAN:  TODAY:   1. Dyslipidemia: is stable  is presently not on medications; will monitor   2. Hypothyroidism: stable  is presently not on medications; will monitor tsh is 4.64   3. Urinary incontinence: stable  will continue myrbetriq 50 mg daily;   4. Dementia: no significnat change in her status; is presently not on medications; will monitor her weight is stable at 139 pounds   5. Osteoarthritis:  Is stable is presently no complaint of pain present; will not make changes and will monitor   PREVIOUS:  6. Glaucoma: stable will continue xalatan nightly to both eyes  7. Constipation: stable will continue colace daily  8.  Hypertension:b/p: 125/72: is stable   will  continue norvasc 10 mg daily benicar 40  mg daily; will continue toprol xl 25 mg daily and asa 81 mg daily      Ok Edwards NP [facility]  Contact [phone] Monday through Friday 8am- 5pm  After hours call [phone]

## 2016-09-12 DIAGNOSIS — E039 Hypothyroidism, unspecified: Secondary | ICD-10-CM | POA: Diagnosis not present

## 2016-09-12 DIAGNOSIS — E785 Hyperlipidemia, unspecified: Secondary | ICD-10-CM | POA: Diagnosis not present

## 2016-09-12 DIAGNOSIS — R739 Hyperglycemia, unspecified: Secondary | ICD-10-CM | POA: Diagnosis not present

## 2016-09-12 DIAGNOSIS — M1990 Unspecified osteoarthritis, unspecified site: Secondary | ICD-10-CM | POA: Diagnosis not present

## 2016-09-12 DIAGNOSIS — H42 Glaucoma in diseases classified elsewhere: Secondary | ICD-10-CM | POA: Diagnosis not present

## 2016-09-12 DIAGNOSIS — K5790 Diverticulosis of intestine, part unspecified, without perforation or abscess without bleeding: Secondary | ICD-10-CM | POA: Diagnosis not present

## 2016-09-12 DIAGNOSIS — I10 Essential (primary) hypertension: Secondary | ICD-10-CM | POA: Diagnosis not present

## 2016-09-13 DIAGNOSIS — R739 Hyperglycemia, unspecified: Secondary | ICD-10-CM | POA: Diagnosis not present

## 2016-09-13 DIAGNOSIS — H42 Glaucoma in diseases classified elsewhere: Secondary | ICD-10-CM | POA: Diagnosis not present

## 2016-09-13 DIAGNOSIS — K5790 Diverticulosis of intestine, part unspecified, without perforation or abscess without bleeding: Secondary | ICD-10-CM | POA: Diagnosis not present

## 2016-09-13 DIAGNOSIS — E039 Hypothyroidism, unspecified: Secondary | ICD-10-CM | POA: Diagnosis not present

## 2016-09-13 DIAGNOSIS — E785 Hyperlipidemia, unspecified: Secondary | ICD-10-CM | POA: Diagnosis not present

## 2016-09-13 DIAGNOSIS — M1990 Unspecified osteoarthritis, unspecified site: Secondary | ICD-10-CM | POA: Diagnosis not present

## 2016-09-13 DIAGNOSIS — I10 Essential (primary) hypertension: Secondary | ICD-10-CM | POA: Diagnosis not present

## 2016-09-14 DIAGNOSIS — E039 Hypothyroidism, unspecified: Secondary | ICD-10-CM | POA: Diagnosis not present

## 2016-09-14 DIAGNOSIS — M1990 Unspecified osteoarthritis, unspecified site: Secondary | ICD-10-CM | POA: Diagnosis not present

## 2016-09-14 DIAGNOSIS — I10 Essential (primary) hypertension: Secondary | ICD-10-CM | POA: Diagnosis not present

## 2016-09-14 DIAGNOSIS — R739 Hyperglycemia, unspecified: Secondary | ICD-10-CM | POA: Diagnosis not present

## 2016-09-14 DIAGNOSIS — E785 Hyperlipidemia, unspecified: Secondary | ICD-10-CM | POA: Diagnosis not present

## 2016-09-14 DIAGNOSIS — H42 Glaucoma in diseases classified elsewhere: Secondary | ICD-10-CM | POA: Diagnosis not present

## 2016-09-14 DIAGNOSIS — K5790 Diverticulosis of intestine, part unspecified, without perforation or abscess without bleeding: Secondary | ICD-10-CM | POA: Diagnosis not present

## 2016-09-15 DIAGNOSIS — H42 Glaucoma in diseases classified elsewhere: Secondary | ICD-10-CM | POA: Diagnosis not present

## 2016-09-15 DIAGNOSIS — I10 Essential (primary) hypertension: Secondary | ICD-10-CM | POA: Diagnosis not present

## 2016-09-15 DIAGNOSIS — E785 Hyperlipidemia, unspecified: Secondary | ICD-10-CM | POA: Diagnosis not present

## 2016-09-15 DIAGNOSIS — R739 Hyperglycemia, unspecified: Secondary | ICD-10-CM | POA: Diagnosis not present

## 2016-09-15 DIAGNOSIS — E039 Hypothyroidism, unspecified: Secondary | ICD-10-CM | POA: Diagnosis not present

## 2016-09-15 DIAGNOSIS — K5790 Diverticulosis of intestine, part unspecified, without perforation or abscess without bleeding: Secondary | ICD-10-CM | POA: Diagnosis not present

## 2016-09-15 DIAGNOSIS — M1990 Unspecified osteoarthritis, unspecified site: Secondary | ICD-10-CM | POA: Diagnosis not present

## 2016-09-18 DIAGNOSIS — K5790 Diverticulosis of intestine, part unspecified, without perforation or abscess without bleeding: Secondary | ICD-10-CM | POA: Diagnosis not present

## 2016-09-18 DIAGNOSIS — H42 Glaucoma in diseases classified elsewhere: Secondary | ICD-10-CM | POA: Diagnosis not present

## 2016-09-18 DIAGNOSIS — R739 Hyperglycemia, unspecified: Secondary | ICD-10-CM | POA: Diagnosis not present

## 2016-09-18 DIAGNOSIS — I10 Essential (primary) hypertension: Secondary | ICD-10-CM | POA: Diagnosis not present

## 2016-09-18 DIAGNOSIS — E039 Hypothyroidism, unspecified: Secondary | ICD-10-CM | POA: Diagnosis not present

## 2016-09-18 DIAGNOSIS — E785 Hyperlipidemia, unspecified: Secondary | ICD-10-CM | POA: Diagnosis not present

## 2016-09-18 DIAGNOSIS — M1990 Unspecified osteoarthritis, unspecified site: Secondary | ICD-10-CM | POA: Diagnosis not present

## 2016-09-19 DIAGNOSIS — K5790 Diverticulosis of intestine, part unspecified, without perforation or abscess without bleeding: Secondary | ICD-10-CM | POA: Diagnosis not present

## 2016-09-19 DIAGNOSIS — I10 Essential (primary) hypertension: Secondary | ICD-10-CM | POA: Diagnosis not present

## 2016-09-19 DIAGNOSIS — H42 Glaucoma in diseases classified elsewhere: Secondary | ICD-10-CM | POA: Diagnosis not present

## 2016-09-19 DIAGNOSIS — E039 Hypothyroidism, unspecified: Secondary | ICD-10-CM | POA: Diagnosis not present

## 2016-09-19 DIAGNOSIS — R739 Hyperglycemia, unspecified: Secondary | ICD-10-CM | POA: Diagnosis not present

## 2016-09-19 DIAGNOSIS — M1990 Unspecified osteoarthritis, unspecified site: Secondary | ICD-10-CM | POA: Diagnosis not present

## 2016-09-19 DIAGNOSIS — E785 Hyperlipidemia, unspecified: Secondary | ICD-10-CM | POA: Diagnosis not present

## 2016-09-20 DIAGNOSIS — I10 Essential (primary) hypertension: Secondary | ICD-10-CM | POA: Diagnosis not present

## 2016-09-20 DIAGNOSIS — E039 Hypothyroidism, unspecified: Secondary | ICD-10-CM | POA: Diagnosis not present

## 2016-09-20 DIAGNOSIS — R739 Hyperglycemia, unspecified: Secondary | ICD-10-CM | POA: Diagnosis not present

## 2016-09-20 DIAGNOSIS — E785 Hyperlipidemia, unspecified: Secondary | ICD-10-CM | POA: Diagnosis not present

## 2016-09-20 DIAGNOSIS — H42 Glaucoma in diseases classified elsewhere: Secondary | ICD-10-CM | POA: Diagnosis not present

## 2016-09-20 DIAGNOSIS — M1990 Unspecified osteoarthritis, unspecified site: Secondary | ICD-10-CM | POA: Diagnosis not present

## 2016-09-20 DIAGNOSIS — K5790 Diverticulosis of intestine, part unspecified, without perforation or abscess without bleeding: Secondary | ICD-10-CM | POA: Diagnosis not present

## 2016-09-21 DIAGNOSIS — E039 Hypothyroidism, unspecified: Secondary | ICD-10-CM | POA: Diagnosis not present

## 2016-09-21 DIAGNOSIS — E785 Hyperlipidemia, unspecified: Secondary | ICD-10-CM | POA: Diagnosis not present

## 2016-09-21 DIAGNOSIS — M1990 Unspecified osteoarthritis, unspecified site: Secondary | ICD-10-CM | POA: Diagnosis not present

## 2016-09-21 DIAGNOSIS — H42 Glaucoma in diseases classified elsewhere: Secondary | ICD-10-CM | POA: Diagnosis not present

## 2016-09-21 DIAGNOSIS — R739 Hyperglycemia, unspecified: Secondary | ICD-10-CM | POA: Diagnosis not present

## 2016-09-21 DIAGNOSIS — I10 Essential (primary) hypertension: Secondary | ICD-10-CM | POA: Diagnosis not present

## 2016-09-21 DIAGNOSIS — K5790 Diverticulosis of intestine, part unspecified, without perforation or abscess without bleeding: Secondary | ICD-10-CM | POA: Diagnosis not present

## 2016-09-22 DIAGNOSIS — E039 Hypothyroidism, unspecified: Secondary | ICD-10-CM | POA: Diagnosis not present

## 2016-09-22 DIAGNOSIS — I10 Essential (primary) hypertension: Secondary | ICD-10-CM | POA: Diagnosis not present

## 2016-09-22 DIAGNOSIS — R739 Hyperglycemia, unspecified: Secondary | ICD-10-CM | POA: Diagnosis not present

## 2016-09-22 DIAGNOSIS — H42 Glaucoma in diseases classified elsewhere: Secondary | ICD-10-CM | POA: Diagnosis not present

## 2016-09-22 DIAGNOSIS — K5790 Diverticulosis of intestine, part unspecified, without perforation or abscess without bleeding: Secondary | ICD-10-CM | POA: Diagnosis not present

## 2016-09-22 DIAGNOSIS — E785 Hyperlipidemia, unspecified: Secondary | ICD-10-CM | POA: Diagnosis not present

## 2016-09-22 DIAGNOSIS — M1990 Unspecified osteoarthritis, unspecified site: Secondary | ICD-10-CM | POA: Diagnosis not present

## 2016-09-25 DIAGNOSIS — E785 Hyperlipidemia, unspecified: Secondary | ICD-10-CM | POA: Diagnosis not present

## 2016-09-25 DIAGNOSIS — K5790 Diverticulosis of intestine, part unspecified, without perforation or abscess without bleeding: Secondary | ICD-10-CM | POA: Diagnosis not present

## 2016-09-25 DIAGNOSIS — I10 Essential (primary) hypertension: Secondary | ICD-10-CM | POA: Diagnosis not present

## 2016-09-25 DIAGNOSIS — M1990 Unspecified osteoarthritis, unspecified site: Secondary | ICD-10-CM | POA: Diagnosis not present

## 2016-09-25 DIAGNOSIS — R739 Hyperglycemia, unspecified: Secondary | ICD-10-CM | POA: Diagnosis not present

## 2016-09-25 DIAGNOSIS — E039 Hypothyroidism, unspecified: Secondary | ICD-10-CM | POA: Diagnosis not present

## 2016-09-25 DIAGNOSIS — H42 Glaucoma in diseases classified elsewhere: Secondary | ICD-10-CM | POA: Diagnosis not present

## 2016-09-26 DIAGNOSIS — H42 Glaucoma in diseases classified elsewhere: Secondary | ICD-10-CM | POA: Diagnosis not present

## 2016-09-26 DIAGNOSIS — E785 Hyperlipidemia, unspecified: Secondary | ICD-10-CM | POA: Diagnosis not present

## 2016-09-26 DIAGNOSIS — R739 Hyperglycemia, unspecified: Secondary | ICD-10-CM | POA: Diagnosis not present

## 2016-09-26 DIAGNOSIS — I10 Essential (primary) hypertension: Secondary | ICD-10-CM | POA: Diagnosis not present

## 2016-09-26 DIAGNOSIS — M1990 Unspecified osteoarthritis, unspecified site: Secondary | ICD-10-CM | POA: Diagnosis not present

## 2016-09-26 DIAGNOSIS — E039 Hypothyroidism, unspecified: Secondary | ICD-10-CM | POA: Diagnosis not present

## 2016-09-26 DIAGNOSIS — K5790 Diverticulosis of intestine, part unspecified, without perforation or abscess without bleeding: Secondary | ICD-10-CM | POA: Diagnosis not present

## 2016-09-27 DIAGNOSIS — E785 Hyperlipidemia, unspecified: Secondary | ICD-10-CM | POA: Diagnosis not present

## 2016-09-27 DIAGNOSIS — R739 Hyperglycemia, unspecified: Secondary | ICD-10-CM | POA: Diagnosis not present

## 2016-09-27 DIAGNOSIS — M1990 Unspecified osteoarthritis, unspecified site: Secondary | ICD-10-CM | POA: Diagnosis not present

## 2016-09-27 DIAGNOSIS — H42 Glaucoma in diseases classified elsewhere: Secondary | ICD-10-CM | POA: Diagnosis not present

## 2016-09-27 DIAGNOSIS — K5790 Diverticulosis of intestine, part unspecified, without perforation or abscess without bleeding: Secondary | ICD-10-CM | POA: Diagnosis not present

## 2016-09-27 DIAGNOSIS — E039 Hypothyroidism, unspecified: Secondary | ICD-10-CM | POA: Diagnosis not present

## 2016-09-27 DIAGNOSIS — I10 Essential (primary) hypertension: Secondary | ICD-10-CM | POA: Diagnosis not present

## 2016-09-28 DIAGNOSIS — E039 Hypothyroidism, unspecified: Secondary | ICD-10-CM | POA: Diagnosis not present

## 2016-09-28 DIAGNOSIS — R739 Hyperglycemia, unspecified: Secondary | ICD-10-CM | POA: Diagnosis not present

## 2016-09-28 DIAGNOSIS — M1990 Unspecified osteoarthritis, unspecified site: Secondary | ICD-10-CM | POA: Diagnosis not present

## 2016-09-28 DIAGNOSIS — K5790 Diverticulosis of intestine, part unspecified, without perforation or abscess without bleeding: Secondary | ICD-10-CM | POA: Diagnosis not present

## 2016-09-28 DIAGNOSIS — I10 Essential (primary) hypertension: Secondary | ICD-10-CM | POA: Diagnosis not present

## 2016-09-28 DIAGNOSIS — E785 Hyperlipidemia, unspecified: Secondary | ICD-10-CM | POA: Diagnosis not present

## 2016-09-28 DIAGNOSIS — H42 Glaucoma in diseases classified elsewhere: Secondary | ICD-10-CM | POA: Diagnosis not present

## 2016-09-29 DIAGNOSIS — M1990 Unspecified osteoarthritis, unspecified site: Secondary | ICD-10-CM | POA: Diagnosis not present

## 2016-09-29 DIAGNOSIS — R739 Hyperglycemia, unspecified: Secondary | ICD-10-CM | POA: Diagnosis not present

## 2016-09-29 DIAGNOSIS — I10 Essential (primary) hypertension: Secondary | ICD-10-CM | POA: Diagnosis not present

## 2016-09-29 DIAGNOSIS — E785 Hyperlipidemia, unspecified: Secondary | ICD-10-CM | POA: Diagnosis not present

## 2016-09-29 DIAGNOSIS — K5790 Diverticulosis of intestine, part unspecified, without perforation or abscess without bleeding: Secondary | ICD-10-CM | POA: Diagnosis not present

## 2016-09-29 DIAGNOSIS — E039 Hypothyroidism, unspecified: Secondary | ICD-10-CM | POA: Diagnosis not present

## 2016-09-29 DIAGNOSIS — H42 Glaucoma in diseases classified elsewhere: Secondary | ICD-10-CM | POA: Diagnosis not present

## 2016-10-02 ENCOUNTER — Encounter: Payer: Self-pay | Admitting: Internal Medicine

## 2016-10-02 ENCOUNTER — Non-Acute Institutional Stay (SKILLED_NURSING_FACILITY): Payer: Medicare Other | Admitting: Internal Medicine

## 2016-10-02 DIAGNOSIS — E034 Atrophy of thyroid (acquired): Secondary | ICD-10-CM | POA: Diagnosis not present

## 2016-10-02 DIAGNOSIS — F0390 Unspecified dementia without behavioral disturbance: Secondary | ICD-10-CM | POA: Diagnosis not present

## 2016-10-02 DIAGNOSIS — I10 Essential (primary) hypertension: Secondary | ICD-10-CM

## 2016-10-02 DIAGNOSIS — H42 Glaucoma in diseases classified elsewhere: Secondary | ICD-10-CM | POA: Diagnosis not present

## 2016-10-02 DIAGNOSIS — K5790 Diverticulosis of intestine, part unspecified, without perforation or abscess without bleeding: Secondary | ICD-10-CM | POA: Diagnosis not present

## 2016-10-02 DIAGNOSIS — E785 Hyperlipidemia, unspecified: Secondary | ICD-10-CM | POA: Diagnosis not present

## 2016-10-02 DIAGNOSIS — E039 Hypothyroidism, unspecified: Secondary | ICD-10-CM | POA: Diagnosis not present

## 2016-10-02 DIAGNOSIS — R739 Hyperglycemia, unspecified: Secondary | ICD-10-CM | POA: Diagnosis not present

## 2016-10-02 DIAGNOSIS — M150 Primary generalized (osteo)arthritis: Secondary | ICD-10-CM | POA: Diagnosis not present

## 2016-10-02 DIAGNOSIS — M1990 Unspecified osteoarthritis, unspecified site: Secondary | ICD-10-CM | POA: Diagnosis not present

## 2016-10-02 DIAGNOSIS — M159 Polyosteoarthritis, unspecified: Secondary | ICD-10-CM

## 2016-10-02 NOTE — Progress Notes (Signed)
Patient ID: Cindy Hayes, female   DOB: 02/22/1913, 83 y.o.   MRN: [id number]     DATE:  October 02, 2016  Location:   [facility] Room Number: 110 B Place of Service: SNF (31)   Extended Emergency Contact Information Primary Emergency Contact: Esperanza Heir Address: [address]          [place] [address] [place] Phone: [phone] Relation: Son Secondary Emergency Contact: Rennis Chris [place] Phone: [phone] Relation: Other  Advanced Directive information Does Patient Have a Medical Advance Directive?: Yes, Type of Advance Directive: Out of facility DNR (pink MOST or yellow form), Pre-existing out of facility DNR order (yellow form or pink MOST form): Yellow form placed in chart (order not valid for inpatient use);Pink MOST form placed in chart (order not valid for inpatient use), Does patient want to make changes to medical advance directive?: No - Patient declined  Chief Complaint  Patient presents with  . Medical Management of Chronic Issues    i month follow up    HPI:  83 yo female long term resident seen today for f/u. She has no concerns. No nursing issues. No falls. Appetite ok and sleeps well. She is HOH. She remains pretty active and walks well with assistance. She is a poor historian due to dementia. Hx obtained from chart.  Dyslipidemia - diet controlled.    Hypothyroidism - stable off thyroid replacemt med. TSH 4.37  Urinary incontinence - stable on myrbetriq 50 mg daily;   Dementia - stable. weight is stable at 141 lbs  Osteoarthritis - pain controlled  Glaucoma - stable on xalatan nightly to both eyes  Constipation - stable on colace daily  Hypertension - BP stable on norvasc 10 mg daily; benicar 40  mg daily; toprol xl 25 mg daily; ASA 81 mg daily    Past Medical History:  Diagnosis Date  . Dementia without behavioral disturbance 01/14/2015  . Diverticulosis   . Hard of  hearing   . Hyperkalemia   . Hyperlipidemia   . Hypertension   . Memory loss   . Mitral regurgitation   . Onychomycosis of toenail   . Pedal edema     Past Surgical History:  Procedure Laterality Date  . anal stenosis surgery    . left renal artery stent  1990    Patient Care Team: Gildardo Cranker, DO as PCP - General (Internal Medicine) Nyoka Cowden Phylis Bougie, NP as Nurse Practitioner ([facility], Fernando Salinas ([place])  Social History   Social History  . Marital status: Divorced    Spouse name: N/A  . Number of children: N/A  . Years of education: N/A   Occupational History  . Not on file.   Social History Main Topics  . Smoking status: Former Research scientist (life sciences)  . Smokeless tobacco: Never Used     Comment: "smoked for several years"  . Alcohol use No  . Drug use: No  . Sexual activity: Not on file   Other Topics Concern  . Not on file   Social History Narrative  . No narrative on file     reports that she has quit smoking. She has never used smokeless tobacco. She reports that she does not drink alcohol or use drugs.  Family History  Problem Relation Age of Onset  . Cancer Father   . Cancer Sister    Family Status  Relation Status  . Father (Not Specified)  .  Sister (Not Specified)    Immunization History  Administered Date(s) Administered  . PPD Test 09/10/2015, 09/17/2015    Allergies  Allergen Reactions  . Hctz [Hydrochlorothiazide]   . Penicillins Hives    Has patient had a PCN reaction causing immediate rash, facial/tongue/throat swelling, SOB or lightheadedness with hypotension: No Has patient had a PCN reaction causing severe rash involving mucus membranes or skin necrosis: No (pt had hives and itchy rash)  Has patient had a PCN reaction that required hospitalization: No Has patient had a PCN reaction occurring within the last 10 years: No If all of the above answers are "NO", then may proceed with Cephalosporin  use.     Medications: Patient's Medications  New Prescriptions   No medications on file  Previous Medications   AMLODIPINE (NORVASC) 5 MG TABLET    Take 5 mg by mouth daily.   ASPIRIN 81 MG TABLET    Take 81 mg by mouth daily.   DOCUSATE SODIUM (COLACE) 100 MG CAPSULE    Take 100 mg by mouth at bedtime.    LATANOPROST (XALATAN) 0.005 % OPHTHALMIC SOLUTION    Place 1 drop into both eyes at bedtime.   METOPROLOL SUCCINATE (TOPROL-XL) 25 MG 24 HR TABLET    Take 25 mg by mouth daily.   MYRBETRIQ 50 MG TB24 TABLET    Take 50 mg by mouth daily.    OLMESARTAN (BENICAR) 40 MG TABLET    Take 40 mg by mouth daily.  Modified Medications   No medications on file  Discontinued Medications   No medications on file    Review of Systems  Unable to perform ROS: Dementia (memory loss)    Vitals:   10/02/16 1350  BP: 132/80  Pulse: 80  Resp: 16  Temp: 98 F (36.7 C)  SpO2: 98%  Weight: 141 lb 9.6 oz (64.2 kg)  Height: 4\' 10"  (1.473 m)   Body mass index is 29.59 kg/m.  Physical Exam  Constitutional: She appears well-developed and well-nourished.  Sitting in w/c in NAD, looks well  HENT:  Mouth/Throat: Oropharynx is clear and moist. No oropharyngeal exudate.  MMM; no oral thrush  Eyes: Pupils are equal, round, and reactive to light. No scleral icterus.  Neck: Neck supple. Carotid bruit is not present. No tracheal deviation present. No thyromegaly present.  Cardiovascular: Normal rate, regular rhythm and intact distal pulses.  Exam reveals no gallop and no friction rub.   Murmur (1/6 SEM) heard. +1 pitting LE edema b/l. No calf TTP. TED stockings intact  Pulmonary/Chest: Effort normal and breath sounds normal. No stridor. No respiratory distress. She has no wheezes. She has no rales.  Abdominal: Soft. Normal appearance and bowel sounds are normal. She exhibits no distension and no mass. There is no hepatomegaly. There is no tenderness. There is no rigidity, no rebound and no guarding.  No hernia.  Musculoskeletal: She exhibits edema.  Lymphadenopathy:    She has no cervical adenopathy.  Neurological: She is alert.  Skin: Skin is warm and dry. No rash noted.  Psychiatric: She has a normal mood and affect. Her behavior is normal. Thought content normal.     Labs reviewed: No visits with results within 3 Month(s) from this visit.  Latest known visit with results is:  Nursing Home on 06/07/2016  Component Date Value Ref Range Status  . Glucose 06/08/2016 87   Final  . BUN 06/08/2016 14  4 - 21 Final  . Creatinine 06/08/2016 0.8  0.5 -  1.1 Final  . Potassium 06/08/2016 4.0  3.4 - 5.3 Final  . Sodium 06/08/2016 137  137 - 147 Final  . Alkaline Phosphatase 06/08/2016 105  25 - 125 Final  . ALT 06/08/2016 8  7 - 35 Final  . AST 06/08/2016 15  13 - 35 Final  . Bilirubin, Total 06/08/2016 0.3   Final  . TSH 06/08/2016 4.37  0.41 - 5.90 Final    No results found.   Assessment/Plan   ICD-10-CM   1. Hypothyroidism due to acquired atrophy of thyroid E03.4   2. Essential hypertension I10   3. Dementia without behavioral disturbance, unspecified dementia type F03.90   4. Primary osteoarthritis involving multiple joints M15.0   5. Hyperlipidemia, unspecified hyperlipidemia type E78.5     Cont current meds as ordered  PT/OT/ST as indicated  Will follow  Diamante Truszkowski S. Perlie Gold  [facility] and [facility] [address] [place], [place] [address] [phone] Cell (Monday-Friday 8 AM - 5 PM) [phone] After 5 PM and follow prompts

## 2016-10-03 DIAGNOSIS — I10 Essential (primary) hypertension: Secondary | ICD-10-CM | POA: Diagnosis not present

## 2016-10-03 DIAGNOSIS — E039 Hypothyroidism, unspecified: Secondary | ICD-10-CM | POA: Diagnosis not present

## 2016-10-03 DIAGNOSIS — K5790 Diverticulosis of intestine, part unspecified, without perforation or abscess without bleeding: Secondary | ICD-10-CM | POA: Diagnosis not present

## 2016-10-03 DIAGNOSIS — E785 Hyperlipidemia, unspecified: Secondary | ICD-10-CM | POA: Diagnosis not present

## 2016-10-03 DIAGNOSIS — M1990 Unspecified osteoarthritis, unspecified site: Secondary | ICD-10-CM | POA: Diagnosis not present

## 2016-10-03 DIAGNOSIS — R739 Hyperglycemia, unspecified: Secondary | ICD-10-CM | POA: Diagnosis not present

## 2016-10-03 DIAGNOSIS — H42 Glaucoma in diseases classified elsewhere: Secondary | ICD-10-CM | POA: Diagnosis not present

## 2016-10-04 DIAGNOSIS — R739 Hyperglycemia, unspecified: Secondary | ICD-10-CM | POA: Diagnosis not present

## 2016-10-04 DIAGNOSIS — K5790 Diverticulosis of intestine, part unspecified, without perforation or abscess without bleeding: Secondary | ICD-10-CM | POA: Diagnosis not present

## 2016-10-04 DIAGNOSIS — E785 Hyperlipidemia, unspecified: Secondary | ICD-10-CM | POA: Diagnosis not present

## 2016-10-04 DIAGNOSIS — E039 Hypothyroidism, unspecified: Secondary | ICD-10-CM | POA: Diagnosis not present

## 2016-10-04 DIAGNOSIS — H42 Glaucoma in diseases classified elsewhere: Secondary | ICD-10-CM | POA: Diagnosis not present

## 2016-10-04 DIAGNOSIS — M1990 Unspecified osteoarthritis, unspecified site: Secondary | ICD-10-CM | POA: Diagnosis not present

## 2016-10-04 DIAGNOSIS — I10 Essential (primary) hypertension: Secondary | ICD-10-CM | POA: Diagnosis not present

## 2016-10-06 DIAGNOSIS — K5790 Diverticulosis of intestine, part unspecified, without perforation or abscess without bleeding: Secondary | ICD-10-CM | POA: Diagnosis not present

## 2016-10-06 DIAGNOSIS — E039 Hypothyroidism, unspecified: Secondary | ICD-10-CM | POA: Diagnosis not present

## 2016-10-06 DIAGNOSIS — E785 Hyperlipidemia, unspecified: Secondary | ICD-10-CM | POA: Diagnosis not present

## 2016-10-06 DIAGNOSIS — I10 Essential (primary) hypertension: Secondary | ICD-10-CM | POA: Diagnosis not present

## 2016-10-06 DIAGNOSIS — M1990 Unspecified osteoarthritis, unspecified site: Secondary | ICD-10-CM | POA: Diagnosis not present

## 2016-10-06 DIAGNOSIS — R739 Hyperglycemia, unspecified: Secondary | ICD-10-CM | POA: Diagnosis not present

## 2016-10-06 DIAGNOSIS — H42 Glaucoma in diseases classified elsewhere: Secondary | ICD-10-CM | POA: Diagnosis not present

## 2016-10-07 DIAGNOSIS — R739 Hyperglycemia, unspecified: Secondary | ICD-10-CM | POA: Diagnosis not present

## 2016-10-07 DIAGNOSIS — E039 Hypothyroidism, unspecified: Secondary | ICD-10-CM | POA: Diagnosis not present

## 2016-10-07 DIAGNOSIS — M1990 Unspecified osteoarthritis, unspecified site: Secondary | ICD-10-CM | POA: Diagnosis not present

## 2016-10-07 DIAGNOSIS — H42 Glaucoma in diseases classified elsewhere: Secondary | ICD-10-CM | POA: Diagnosis not present

## 2016-10-07 DIAGNOSIS — I10 Essential (primary) hypertension: Secondary | ICD-10-CM | POA: Diagnosis not present

## 2016-10-07 DIAGNOSIS — E785 Hyperlipidemia, unspecified: Secondary | ICD-10-CM | POA: Diagnosis not present

## 2016-10-07 DIAGNOSIS — K5790 Diverticulosis of intestine, part unspecified, without perforation or abscess without bleeding: Secondary | ICD-10-CM | POA: Diagnosis not present

## 2016-10-09 DIAGNOSIS — M1990 Unspecified osteoarthritis, unspecified site: Secondary | ICD-10-CM | POA: Diagnosis not present

## 2016-10-09 DIAGNOSIS — R739 Hyperglycemia, unspecified: Secondary | ICD-10-CM | POA: Diagnosis not present

## 2016-10-09 DIAGNOSIS — E785 Hyperlipidemia, unspecified: Secondary | ICD-10-CM | POA: Diagnosis not present

## 2016-10-09 DIAGNOSIS — I10 Essential (primary) hypertension: Secondary | ICD-10-CM | POA: Diagnosis not present

## 2016-10-09 DIAGNOSIS — H42 Glaucoma in diseases classified elsewhere: Secondary | ICD-10-CM | POA: Diagnosis not present

## 2016-10-09 DIAGNOSIS — E039 Hypothyroidism, unspecified: Secondary | ICD-10-CM | POA: Diagnosis not present

## 2016-10-09 DIAGNOSIS — K5790 Diverticulosis of intestine, part unspecified, without perforation or abscess without bleeding: Secondary | ICD-10-CM | POA: Diagnosis not present

## 2016-10-31 ENCOUNTER — Non-Acute Institutional Stay (SKILLED_NURSING_FACILITY): Payer: Medicare Other | Admitting: Adult Health

## 2016-10-31 ENCOUNTER — Encounter: Payer: Self-pay | Admitting: Adult Health

## 2016-10-31 DIAGNOSIS — N390 Urinary tract infection, site not specified: Secondary | ICD-10-CM | POA: Diagnosis not present

## 2016-10-31 NOTE — Progress Notes (Signed)
Location:   [place] Room Number: 110 B Place of Service:  SNF (31)   CODE STATUS: DNR  Allergies  Allergen Reactions  . Hctz [Hydrochlorothiazide]   . Penicillins Hives    Has patient had a PCN reaction causing immediate rash, facial/tongue/throat swelling, SOB or lightheadedness with hypotension: No Has patient had a PCN reaction causing severe rash involving mucus membranes or skin necrosis: No (pt had hives and itchy rash)  Has patient had a PCN reaction that required hospitalization: No Has patient had a PCN reaction occurring within the last 10 years: No If all of the above answers are "NO", then may proceed with Cephalosporin use.     Chief Complaint  Patient presents with  . Acute Visit    S/P fall, UTI symptoms    HPI:  She has had a mechanical fall over the past weekend without injury. Staff report that she is having increased confusion and report frequent urination. She is having increased difficulty with conversation today. She does remember falling. She is getting out of bed to her wheelchair today. There are no reports of fevers present. She denies pain.    Past Medical History:  Diagnosis Date  . Dementia without behavioral disturbance 01/14/2015  . Diverticulosis   . Hard of hearing   . Hyperkalemia   . Hyperlipidemia   . Hypertension   . Memory loss   . Mitral regurgitation   . Onychomycosis of toenail   . Pedal edema     Past Surgical History:  Procedure Laterality Date  . anal stenosis surgery    . left renal artery stent  1990    Social History   Social History  . Marital status: Divorced    Spouse name: N/A  . Number of children: N/A  . Years of education: N/A   Occupational History  . Not on file.   Social History Main Topics  . Smoking status: Former Research scientist (life sciences)  . Smokeless tobacco: Never Used     Comment: "smoked for several years"  . Alcohol use No  . Drug use: No  . Sexual activity: Not on file   Other Topics  Concern  . Not on file   Social History Narrative  . No narrative on file   Family History  Problem Relation Age of Onset  . Cancer Father   . Cancer Sister       VITAL SIGNS BP 132/68   Pulse 70   Temp 98.1 F (36.7 C)   Resp 16   Ht 4\' 10"  (1.473 m)   Wt 143 lb 9.6 oz (65.1 kg)   SpO2 96%   BMI 30.01 kg/m   Patient's Medications  New Prescriptions   No medications on file  Previous Medications   AMLODIPINE (NORVASC) 5 MG TABLET    Take 5 mg by mouth daily.   ASPIRIN 81 MG TABLET    Take 81 mg by mouth daily.   DOCUSATE SODIUM (COLACE) 100 MG CAPSULE    Take 100 mg by mouth at bedtime.    LATANOPROST (XALATAN) 0.005 % OPHTHALMIC SOLUTION    Place 1 drop into both eyes at bedtime.   METOPROLOL SUCCINATE (TOPROL-XL) 25 MG 24 HR TABLET    Take 25 mg by mouth daily.   MYRBETRIQ 50 MG TB24 TABLET    Take 50 mg by mouth daily.    OLMESARTAN (BENICAR) 40 MG TABLET    Take 40 mg by mouth daily.  Modified Medications   No  medications on file  Discontinued Medications   No medications on file     SIGNIFICANT DIAGNOSTIC EXAMS  PREVIOUS   12-24-14: left wrist x-ray: 1. Displaced distal radius fracture as described above. 2. Probable nondisplaced ulnar styloid fracture.   12-24-14: left forearm x-ray: Fractures of the distal left radius and ulnar styloid.  NO NEW EXAMS    LABS REVIEWED: PREVIOUS    09-09-15; glucose 114; bun 14.5; creat 0.85; k+ 4.2; na++ 132; liver normal albumin 4.3; tsh 4.64  03-23-16: wbc 8.1; hgb 12.8; hct 37.7; mcv 89.1 plt 267; glucose 98; bun 13.5; create 0.81; k+ 4.1; na++ 134; ca 9.2  06-08-16: glucose 87; bun 13.7; creat 0.76; k+ 4.0; na++137; liver normal albumin 3.8  tsh 4.37   NO NEW LABS  Review of Systems  Constitutional: Negative for malaise/fatigue.  Respiratory: Negative for cough and shortness of breath.   Cardiovascular: Negative for chest pain and leg swelling.  Gastrointestinal: Negative for abdominal pain, constipation and  heartburn.  Musculoskeletal: Negative for back pain and myalgias.  Skin: Negative.   Neurological: Negative for dizziness.  Psychiatric/Behavioral: The patient is not nervous/anxious.    Physical Exam  Constitutional: No distress.  Frail   Neck: Neck supple. No thyromegaly present.  Cardiovascular: Normal rate, regular rhythm and intact distal pulses.  Murmur heard. 1/6  Pulmonary/Chest: Effort normal and breath sounds normal. No respiratory distress.  Abdominal: Soft. Bowel sounds are normal. She exhibits no distension. There is no tenderness.  Musculoskeletal: She exhibits edema.  Is able to move all extremities   Lymphadenopathy:    She has no cervical adenopathy.  Neurological: She is alert.  Skin: Skin is warm and dry. She is not diaphoretic.    ASSESSMENT/ PLAN:  TODAY:   1.  UTI: will get ua/c&s; will check cbc; cmp; will continue to monitor her status.   MD is aware of resident's narcotic use and is in agreement with current plan of care. We will attempt to wean resident as apropriate    Ok Edwards NP [facility]  Contact [phone] Monday through Friday 8am- 5pm  After hours call [phone]

## 2016-11-01 DIAGNOSIS — M6281 Muscle weakness (generalized): Secondary | ICD-10-CM | POA: Diagnosis not present

## 2016-11-01 DIAGNOSIS — E119 Type 2 diabetes mellitus without complications: Secondary | ICD-10-CM | POA: Diagnosis not present

## 2016-11-01 DIAGNOSIS — D649 Anemia, unspecified: Secondary | ICD-10-CM | POA: Diagnosis not present

## 2016-11-01 DIAGNOSIS — R319 Hematuria, unspecified: Secondary | ICD-10-CM | POA: Diagnosis not present

## 2016-11-01 LAB — HEPATIC FUNCTION PANEL
ALK PHOS: 110 (ref 25–125)
ALT: 10 (ref 7–35)
AST: 17 (ref 13–35)
Bilirubin, Total: 0.4

## 2016-11-01 LAB — CBC AND DIFFERENTIAL
HCT: 33 — AB (ref 36–46)
Hemoglobin: 11.6 — AB (ref 12.0–16.0)
NEUTROS ABS: 4
PLATELETS: 221 (ref 150–399)
WBC: 7.3

## 2016-11-01 LAB — BASIC METABOLIC PANEL
BUN: 21 (ref 4–21)
CREATININE: 0.8 (ref 0.5–1.1)
Glucose: 150
POTASSIUM: 4.4 (ref 3.4–5.3)
Sodium: 132 — AB (ref 137–147)

## 2016-11-02 DIAGNOSIS — K5790 Diverticulosis of intestine, part unspecified, without perforation or abscess without bleeding: Secondary | ICD-10-CM | POA: Diagnosis not present

## 2016-11-02 DIAGNOSIS — M1990 Unspecified osteoarthritis, unspecified site: Secondary | ICD-10-CM | POA: Diagnosis not present

## 2016-11-02 DIAGNOSIS — I10 Essential (primary) hypertension: Secondary | ICD-10-CM | POA: Diagnosis not present

## 2016-11-02 DIAGNOSIS — R739 Hyperglycemia, unspecified: Secondary | ICD-10-CM | POA: Diagnosis not present

## 2016-11-02 DIAGNOSIS — H42 Glaucoma in diseases classified elsewhere: Secondary | ICD-10-CM | POA: Diagnosis not present

## 2016-11-02 DIAGNOSIS — E039 Hypothyroidism, unspecified: Secondary | ICD-10-CM | POA: Diagnosis not present

## 2016-11-02 DIAGNOSIS — E785 Hyperlipidemia, unspecified: Secondary | ICD-10-CM | POA: Diagnosis not present

## 2016-11-03 ENCOUNTER — Encounter: Payer: Self-pay | Admitting: Adult Health

## 2016-11-03 ENCOUNTER — Non-Acute Institutional Stay (SKILLED_NURSING_FACILITY): Payer: Medicare Other | Admitting: Adult Health

## 2016-11-03 DIAGNOSIS — K5790 Diverticulosis of intestine, part unspecified, without perforation or abscess without bleeding: Secondary | ICD-10-CM | POA: Diagnosis not present

## 2016-11-03 DIAGNOSIS — F0390 Unspecified dementia without behavioral disturbance: Secondary | ICD-10-CM | POA: Diagnosis not present

## 2016-11-03 DIAGNOSIS — N393 Stress incontinence (female) (male): Secondary | ICD-10-CM

## 2016-11-03 DIAGNOSIS — R739 Hyperglycemia, unspecified: Secondary | ICD-10-CM | POA: Diagnosis not present

## 2016-11-03 DIAGNOSIS — M159 Polyosteoarthritis, unspecified: Secondary | ICD-10-CM

## 2016-11-03 DIAGNOSIS — M150 Primary generalized (osteo)arthritis: Secondary | ICD-10-CM | POA: Diagnosis not present

## 2016-11-03 DIAGNOSIS — E785 Hyperlipidemia, unspecified: Secondary | ICD-10-CM

## 2016-11-03 DIAGNOSIS — E039 Hypothyroidism, unspecified: Secondary | ICD-10-CM | POA: Diagnosis not present

## 2016-11-03 DIAGNOSIS — M1990 Unspecified osteoarthritis, unspecified site: Secondary | ICD-10-CM | POA: Diagnosis not present

## 2016-11-03 DIAGNOSIS — E034 Atrophy of thyroid (acquired): Secondary | ICD-10-CM

## 2016-11-03 DIAGNOSIS — H42 Glaucoma in diseases classified elsewhere: Secondary | ICD-10-CM | POA: Diagnosis not present

## 2016-11-03 DIAGNOSIS — I10 Essential (primary) hypertension: Secondary | ICD-10-CM | POA: Diagnosis not present

## 2016-11-03 NOTE — Progress Notes (Signed)
Location:   [place] Room Number: 110 B Place of Service:  SNF (31)   CODE STATUS: DNR  Allergies  Allergen Reactions  . Hctz [Hydrochlorothiazide]   . Penicillins Hives    Has patient had a PCN reaction causing immediate rash, facial/tongue/throat swelling, SOB or lightheadedness with hypotension: No Has patient had a PCN reaction causing severe rash involving mucus membranes or skin necrosis: No (pt had hives and itchy rash)  Has patient had a PCN reaction that required hospitalization: No Has patient had a PCN reaction occurring within the last 10 years: No If all of the above answers are "NO", then may proceed with Cephalosporin use.     Chief Complaint  Patient presents with  . Medical Management of Chronic Issues    Dyslipidemia; hypothyroidism; UI; dementia; osteoarthritis; glaucoma; constipation    HPI:  She is a 83 year old long term resident of this facility being seen for the management of her chronic illnesses: hypothyroidism due to acquired atrophy; dementia without behavioral disturbance; osteoarthritis; hyperlipidemia; stress incontinence . She has had a fall recently without injury present. She denies any excessive fatigue; no joint or back pain; denies constipation. There are no nursing concerns today.    Past Medical History:  Diagnosis Date  . Dementia without behavioral disturbance 01/14/2015  . Diverticulosis   . Hard of hearing   . Hyperkalemia   . Hyperlipidemia   . Hypertension   . Memory loss   . Mitral regurgitation   . Onychomycosis of toenail   . Pedal edema     Past Surgical History:  Procedure Laterality Date  . anal stenosis surgery    . left renal artery stent  1990    Social History   Social History  . Marital status: Divorced    Spouse name: N/A  . Number of children: N/A  . Years of education: N/A   Occupational History  . Not on file.   Social History Main Topics  . Smoking status: Former Research scientist (life sciences)  .  Smokeless tobacco: Never Used     Comment: "smoked for several years"  . Alcohol use No  . Drug use: No  . Sexual activity: Not on file   Other Topics Concern  . Not on file   Social History Narrative  . No narrative on file   Family History  Problem Relation Age of Onset  . Cancer Father   . Cancer Sister       VITAL SIGNS BP 140/70   Pulse 74   Temp 98.4 F (36.9 C)   Resp 18   Ht 4\' 10"  (1.473 m)   Wt 143 lb 9.6 oz (65.1 kg)   SpO2 97%   BMI 30.01 kg/m    Patient's Medications  New Prescriptions   No medications on file  Previous Medications   AMLODIPINE (NORVASC) 5 MG TABLET    Take 5 mg by mouth daily.   ASPIRIN 81 MG TABLET    Take 81 mg by mouth daily.   DOCUSATE SODIUM (COLACE) 100 MG CAPSULE    Take 100 mg by mouth at bedtime.    LATANOPROST (XALATAN) 0.005 % OPHTHALMIC SOLUTION    Place 1 drop into both eyes at bedtime.   METOPROLOL SUCCINATE (TOPROL-XL) 25 MG 24 HR TABLET    Take 25 mg by mouth daily.   MYRBETRIQ 50 MG TB24 TABLET    Take 50 mg by mouth daily.    OLMESARTAN (BENICAR) 40 MG TABLET  Take 40 mg by mouth daily.  Modified Medications   No medications on file  Discontinued Medications   No medications on file     SIGNIFICANT DIAGNOSTIC EXAMS  PREVIOUS   12-24-14: left wrist x-ray: 1. Displaced distal radius fracture as described above. 2. Probable nondisplaced ulnar styloid fracture.   12-24-14: left forearm x-ray: Fractures of the distal left radius and ulnar styloid.  NO NEW EXAMS    LABS REVIEWED: PREVIOUS    03-23-16: wbc 8.1; hgb 12.8; hct 37.7; mcv 89.1 plt 267; glucose 98; bun 13.5; create 0.81; k+ 4.1; na++ 134; ca 9.2  06-08-16: glucose 87; bun 13.7; creat 0.76; k+ 4.0; na++137; liver normal albumin 3.8  tsh 4.37   TODAY:   11-01-16: glucose 101; bun 21.1; creat 0.82; k+ 4.4; na++ 132; ca 8.9; liver normal albumin 3.8; U/A neg   Review of Systems  Constitutional: Positive for malaise/fatigue.  Respiratory:  Negative for cough and shortness of breath.   Cardiovascular: Negative for chest pain, palpitations and leg swelling.  Gastrointestinal: Negative for abdominal pain, constipation and heartburn.  Musculoskeletal: Negative for back pain, joint pain and myalgias.  Skin: Negative.   Neurological: Negative for dizziness.  Psychiatric/Behavioral: The patient is not nervous/anxious.    Physical Exam  Constitutional: No distress.  Frail    Neck: Neck supple. No thyromegaly present.  Cardiovascular: Normal rate, regular rhythm and intact distal pulses.  Murmur heard. 1/6  Pulmonary/Chest: Effort normal and breath sounds normal. No respiratory distress.  Abdominal: Soft. Bowel sounds are normal. She exhibits no distension. There is no tenderness.  Musculoskeletal: She exhibits edema.  Able to move all extremities Has 2+ lower extremity edema   Lymphadenopathy:    She has no cervical adenopathy.  Neurological: She is alert.  Skin: Skin is warm and dry. She is not diaphoretic.  Psychiatric: She has a normal mood and affect.     ASSESSMENT/ PLAN:   TODAY:   1. Dyslipidemia: is stable  is presently not on medications; will monitor   2. Hypothyroidism: stable  is presently not on medications; will monitor tsh is 4.64   3. Urinary incontinence: stable  will continue myrbetriq 50 mg daily;   4. Dementia: no significnat change in her status; is presently not on medications; will monitor her weight is stable at 143 pounds   5. Osteoarthritis:  Is stable is presently no complaint of pain present; will not make changes and will monitor   6. Glaucoma: stable will continue xalatan nightly to both eyes  7. Constipation: stable will continue colace daily   MD is aware of resident's narcotic use and is in agreement with current plan of care. We will attempt to wean resident as apropriate   Ok Edwards NP [facility]  Contact [phone] Monday through Friday 8am- 5pm  After  hours call [phone]

## 2016-11-06 DIAGNOSIS — I739 Peripheral vascular disease, unspecified: Secondary | ICD-10-CM | POA: Diagnosis not present

## 2016-11-06 DIAGNOSIS — B351 Tinea unguium: Secondary | ICD-10-CM | POA: Diagnosis not present

## 2016-11-06 DIAGNOSIS — R739 Hyperglycemia, unspecified: Secondary | ICD-10-CM | POA: Diagnosis not present

## 2016-11-06 DIAGNOSIS — I10 Essential (primary) hypertension: Secondary | ICD-10-CM | POA: Diagnosis not present

## 2016-11-06 DIAGNOSIS — H42 Glaucoma in diseases classified elsewhere: Secondary | ICD-10-CM | POA: Diagnosis not present

## 2016-11-06 DIAGNOSIS — M1990 Unspecified osteoarthritis, unspecified site: Secondary | ICD-10-CM | POA: Diagnosis not present

## 2016-11-06 DIAGNOSIS — K5790 Diverticulosis of intestine, part unspecified, without perforation or abscess without bleeding: Secondary | ICD-10-CM | POA: Diagnosis not present

## 2016-11-06 DIAGNOSIS — E785 Hyperlipidemia, unspecified: Secondary | ICD-10-CM | POA: Diagnosis not present

## 2016-11-06 DIAGNOSIS — E039 Hypothyroidism, unspecified: Secondary | ICD-10-CM | POA: Diagnosis not present

## 2016-11-07 DIAGNOSIS — K5790 Diverticulosis of intestine, part unspecified, without perforation or abscess without bleeding: Secondary | ICD-10-CM | POA: Diagnosis not present

## 2016-11-07 DIAGNOSIS — E039 Hypothyroidism, unspecified: Secondary | ICD-10-CM | POA: Diagnosis not present

## 2016-11-07 DIAGNOSIS — E785 Hyperlipidemia, unspecified: Secondary | ICD-10-CM | POA: Diagnosis not present

## 2016-11-07 DIAGNOSIS — R739 Hyperglycemia, unspecified: Secondary | ICD-10-CM | POA: Diagnosis not present

## 2016-11-07 DIAGNOSIS — M1990 Unspecified osteoarthritis, unspecified site: Secondary | ICD-10-CM | POA: Diagnosis not present

## 2016-11-07 DIAGNOSIS — H42 Glaucoma in diseases classified elsewhere: Secondary | ICD-10-CM | POA: Diagnosis not present

## 2016-11-07 DIAGNOSIS — I10 Essential (primary) hypertension: Secondary | ICD-10-CM | POA: Diagnosis not present

## 2016-11-08 DIAGNOSIS — K5790 Diverticulosis of intestine, part unspecified, without perforation or abscess without bleeding: Secondary | ICD-10-CM | POA: Diagnosis not present

## 2016-11-08 DIAGNOSIS — M1990 Unspecified osteoarthritis, unspecified site: Secondary | ICD-10-CM | POA: Diagnosis not present

## 2016-11-08 DIAGNOSIS — E785 Hyperlipidemia, unspecified: Secondary | ICD-10-CM | POA: Diagnosis not present

## 2016-11-08 DIAGNOSIS — H42 Glaucoma in diseases classified elsewhere: Secondary | ICD-10-CM | POA: Diagnosis not present

## 2016-11-08 DIAGNOSIS — E039 Hypothyroidism, unspecified: Secondary | ICD-10-CM | POA: Diagnosis not present

## 2016-11-08 DIAGNOSIS — I10 Essential (primary) hypertension: Secondary | ICD-10-CM | POA: Diagnosis not present

## 2016-11-08 DIAGNOSIS — R739 Hyperglycemia, unspecified: Secondary | ICD-10-CM | POA: Diagnosis not present

## 2016-11-09 DIAGNOSIS — H42 Glaucoma in diseases classified elsewhere: Secondary | ICD-10-CM | POA: Diagnosis not present

## 2016-11-09 DIAGNOSIS — I10 Essential (primary) hypertension: Secondary | ICD-10-CM | POA: Diagnosis not present

## 2016-11-09 DIAGNOSIS — E039 Hypothyroidism, unspecified: Secondary | ICD-10-CM | POA: Diagnosis not present

## 2016-11-09 DIAGNOSIS — R739 Hyperglycemia, unspecified: Secondary | ICD-10-CM | POA: Diagnosis not present

## 2016-11-09 DIAGNOSIS — E785 Hyperlipidemia, unspecified: Secondary | ICD-10-CM | POA: Diagnosis not present

## 2016-11-09 DIAGNOSIS — M1990 Unspecified osteoarthritis, unspecified site: Secondary | ICD-10-CM | POA: Diagnosis not present

## 2016-11-09 DIAGNOSIS — K5790 Diverticulosis of intestine, part unspecified, without perforation or abscess without bleeding: Secondary | ICD-10-CM | POA: Diagnosis not present

## 2016-11-10 DIAGNOSIS — R739 Hyperglycemia, unspecified: Secondary | ICD-10-CM | POA: Diagnosis not present

## 2016-11-10 DIAGNOSIS — H42 Glaucoma in diseases classified elsewhere: Secondary | ICD-10-CM | POA: Diagnosis not present

## 2016-11-10 DIAGNOSIS — E785 Hyperlipidemia, unspecified: Secondary | ICD-10-CM | POA: Diagnosis not present

## 2016-11-10 DIAGNOSIS — I10 Essential (primary) hypertension: Secondary | ICD-10-CM | POA: Diagnosis not present

## 2016-11-10 DIAGNOSIS — M1990 Unspecified osteoarthritis, unspecified site: Secondary | ICD-10-CM | POA: Diagnosis not present

## 2016-11-10 DIAGNOSIS — E039 Hypothyroidism, unspecified: Secondary | ICD-10-CM | POA: Diagnosis not present

## 2016-11-10 DIAGNOSIS — K5790 Diverticulosis of intestine, part unspecified, without perforation or abscess without bleeding: Secondary | ICD-10-CM | POA: Diagnosis not present

## 2016-11-13 DIAGNOSIS — E785 Hyperlipidemia, unspecified: Secondary | ICD-10-CM | POA: Diagnosis not present

## 2016-11-13 DIAGNOSIS — R739 Hyperglycemia, unspecified: Secondary | ICD-10-CM | POA: Diagnosis not present

## 2016-11-13 DIAGNOSIS — Z79899 Other long term (current) drug therapy: Secondary | ICD-10-CM | POA: Diagnosis not present

## 2016-11-13 DIAGNOSIS — E039 Hypothyroidism, unspecified: Secondary | ICD-10-CM | POA: Diagnosis not present

## 2016-11-13 DIAGNOSIS — N390 Urinary tract infection, site not specified: Secondary | ICD-10-CM | POA: Diagnosis not present

## 2016-11-13 DIAGNOSIS — I10 Essential (primary) hypertension: Secondary | ICD-10-CM | POA: Diagnosis not present

## 2016-11-13 DIAGNOSIS — M1990 Unspecified osteoarthritis, unspecified site: Secondary | ICD-10-CM | POA: Diagnosis not present

## 2016-11-13 DIAGNOSIS — K5790 Diverticulosis of intestine, part unspecified, without perforation or abscess without bleeding: Secondary | ICD-10-CM | POA: Diagnosis not present

## 2016-11-13 DIAGNOSIS — H42 Glaucoma in diseases classified elsewhere: Secondary | ICD-10-CM | POA: Diagnosis not present

## 2016-11-14 DIAGNOSIS — R739 Hyperglycemia, unspecified: Secondary | ICD-10-CM | POA: Diagnosis not present

## 2016-11-14 DIAGNOSIS — H42 Glaucoma in diseases classified elsewhere: Secondary | ICD-10-CM | POA: Diagnosis not present

## 2016-11-14 DIAGNOSIS — E039 Hypothyroidism, unspecified: Secondary | ICD-10-CM | POA: Diagnosis not present

## 2016-11-14 DIAGNOSIS — I10 Essential (primary) hypertension: Secondary | ICD-10-CM | POA: Diagnosis not present

## 2016-11-14 DIAGNOSIS — E785 Hyperlipidemia, unspecified: Secondary | ICD-10-CM | POA: Diagnosis not present

## 2016-11-14 DIAGNOSIS — K5790 Diverticulosis of intestine, part unspecified, without perforation or abscess without bleeding: Secondary | ICD-10-CM | POA: Diagnosis not present

## 2016-11-14 DIAGNOSIS — M1990 Unspecified osteoarthritis, unspecified site: Secondary | ICD-10-CM | POA: Diagnosis not present

## 2016-11-15 ENCOUNTER — Encounter: Payer: Self-pay | Admitting: Adult Health

## 2016-11-15 ENCOUNTER — Non-Acute Institutional Stay (SKILLED_NURSING_FACILITY): Payer: Medicare Other | Admitting: Adult Health

## 2016-11-15 DIAGNOSIS — E039 Hypothyroidism, unspecified: Secondary | ICD-10-CM | POA: Diagnosis not present

## 2016-11-15 DIAGNOSIS — I10 Essential (primary) hypertension: Secondary | ICD-10-CM | POA: Diagnosis not present

## 2016-11-15 DIAGNOSIS — M1990 Unspecified osteoarthritis, unspecified site: Secondary | ICD-10-CM | POA: Diagnosis not present

## 2016-11-15 DIAGNOSIS — H42 Glaucoma in diseases classified elsewhere: Secondary | ICD-10-CM | POA: Diagnosis not present

## 2016-11-15 DIAGNOSIS — N390 Urinary tract infection, site not specified: Secondary | ICD-10-CM | POA: Diagnosis not present

## 2016-11-15 DIAGNOSIS — K5790 Diverticulosis of intestine, part unspecified, without perforation or abscess without bleeding: Secondary | ICD-10-CM | POA: Diagnosis not present

## 2016-11-15 DIAGNOSIS — R739 Hyperglycemia, unspecified: Secondary | ICD-10-CM | POA: Diagnosis not present

## 2016-11-15 DIAGNOSIS — E785 Hyperlipidemia, unspecified: Secondary | ICD-10-CM | POA: Diagnosis not present

## 2016-11-15 NOTE — Progress Notes (Signed)
Location:   [place] Room Number: 110 B Place of Service:  SNF (31)   CODE STATUS: DNR  Allergies  Allergen Reactions  . Hctz [Hydrochlorothiazide]   . Penicillins Hives    Has patient had a PCN reaction causing immediate rash, facial/tongue/throat swelling, SOB or lightheadedness with hypotension: No Has patient had a PCN reaction causing severe rash involving mucus membranes or skin necrosis: No (pt had hives and itchy rash)  Has patient had a PCN reaction that required hospitalization: No Has patient had a PCN reaction occurring within the last 10 years: No If all of the above answers are "NO", then may proceed with Cephalosporin use.     Chief Complaint  Patient presents with  . Acute Visit    Confusion / falls    HPI:  She has had several falls over the past week without injuries present. Staff had reported that she has had increased frequency and urgency. She did have a urine culture done which is now demonstrating gram neg rod. There are no reports of fevers present; no changes in appetite; she is weak.    Past Medical History:  Diagnosis Date  . Dementia without behavioral disturbance 01/14/2015  . Diverticulosis   . Hard of hearing   . Hyperkalemia   . Hyperlipidemia   . Hypertension   . Memory loss   . Mitral regurgitation   . Onychomycosis of toenail   . Pedal edema     Past Surgical History:  Procedure Laterality Date  . anal stenosis surgery    . left renal artery stent  1990    Social History   Socioeconomic History  . Marital status: Divorced    Spouse name: Not on file  . Number of children: Not on file  . Years of education: Not on file  . Highest education level: Not on file  Social Needs  . Financial resource strain: Not on file  . Food insecurity - worry: Not on file  . Food insecurity - inability: Not on file  . Transportation needs - medical: Not on file  . Transportation needs - non-medical: Not on file    Occupational History  . Not on file  Tobacco Use  . Smoking status: Former Research scientist (life sciences)  . Smokeless tobacco: Never Used  . Tobacco comment: "smoked for several years"  Substance and Sexual Activity  . Alcohol use: No    Alcohol/week: 0.0 oz  . Drug use: No  . Sexual activity: Not on file  Other Topics Concern  . Not on file  Social History Narrative  . Not on file   Family History  Problem Relation Age of Onset  . Cancer Father   . Cancer Sister       VITAL SIGNS BP (!) 142/57   Pulse 77   Temp 98.2 F (36.8 C)   Resp 18   Ht 4\' 10"  (1.473 m)   Wt 142 lb 1.6 oz (64.5 kg)   SpO2 97%   BMI 29.70 kg/m     Medication List        Accurate as of 11/15/16 12:34 PM. Always use your most recent med list.          acetaminophen 325 MG tablet Commonly known as:  TYLENOL   amLODipine 5 MG tablet Commonly known as:  NORVASC   aspirin 81 MG tablet   docusate sodium 100 MG capsule Commonly known as:  COLACE   latanoprost 0.005 % ophthalmic solution Commonly known as:  XALATAN   metoprolol succinate 25 MG 24 hr tablet Commonly known as:  TOPROL-XL   MYRBETRIQ 50 MG Tb24 tablet Generic drug:  mirabegron ER   olmesartan 40 MG tablet Commonly known as:  BENICAR        SIGNIFICANT DIAGNOSTIC EXAMS   PREVIOUS   12-24-14: left wrist x-ray: 1. Displaced distal radius fracture as described above. 2. Probable nondisplaced ulnar styloid fracture.   12-24-14: left forearm x-ray: Fractures of the distal left radius and ulnar styloid.  NO NEW EXAMS    LABS REVIEWED: PREVIOUS    03-23-16: wbc 8.1; hgb 12.8; hct 37.7; mcv 89.1 plt 267; glucose 98; bun 13.5; create 0.81; k+ 4.1; na++ 134; ca 9.2  06-08-16: glucose 87; bun 13.7; creat 0.76; k+ 4.0; na++137; liver normal albumin 3.8  tsh 4.37  11-01-16: wbc 7.23; hgb 11.6; hct 32.6; mcv 91.2 ;plt 221 glucose 101; bun 21.1; creat 0.82; k+ 4.4; na++ 132; ca 8.9; liver normal albumin 3.8; U/A neg  TODAY:   11-03-16:  urine culture: 100,000 colonies: lactose fermenting gram negative rods   Review of Systems  Constitutional: Positive for malaise/fatigue. Negative for fever.  Respiratory: Negative for cough and shortness of breath.   Cardiovascular: Positive for leg swelling. Negative for chest pain.  Gastrointestinal: Negative for abdominal pain, constipation and heartburn.  Genitourinary: Negative for dysuria.  Musculoskeletal: Negative for back pain and myalgias.  Skin: Negative.   Neurological: Negative for dizziness.  Psychiatric/Behavioral: The patient is not nervous/anxious.     Physical Exam  Constitutional: No distress.  Frail   Neck: No thyromegaly present.  Cardiovascular: Normal rate, regular rhythm and intact distal pulses.  Murmur heard. 1/6  Pulmonary/Chest: Effort normal and breath sounds normal. No respiratory distress.  Abdominal: Soft. Bowel sounds are normal. She exhibits no distension. There is no tenderness.  Musculoskeletal: She exhibits edema.  Able to move all extremities Has 1+ edema on left leg Has trace on right leg    Lymphadenopathy:    She has no cervical adenopathy.  Neurological: She is alert.  Skin: Skin is warm and dry. She is not diaphoretic.  Psychiatric: She has a normal mood and affect.    ASSESSMENT/ PLAN:  1. UTI: will begin cipro 250 mg twice daily for 10 days with probiotic and will monitor   MD is aware of resident's narcotic use and is in agreement with current plan of care. We will attempt to wean resident as apropriate   Ok Edwards NP [facility]  Contact [phone] Monday through Friday 8am- 5pm  After hours call [phone]

## 2016-11-16 DIAGNOSIS — I10 Essential (primary) hypertension: Secondary | ICD-10-CM | POA: Diagnosis not present

## 2016-11-16 DIAGNOSIS — E785 Hyperlipidemia, unspecified: Secondary | ICD-10-CM | POA: Diagnosis not present

## 2016-11-16 DIAGNOSIS — H42 Glaucoma in diseases classified elsewhere: Secondary | ICD-10-CM | POA: Diagnosis not present

## 2016-11-16 DIAGNOSIS — K5790 Diverticulosis of intestine, part unspecified, without perforation or abscess without bleeding: Secondary | ICD-10-CM | POA: Diagnosis not present

## 2016-11-16 DIAGNOSIS — E039 Hypothyroidism, unspecified: Secondary | ICD-10-CM | POA: Diagnosis not present

## 2016-11-16 DIAGNOSIS — R739 Hyperglycemia, unspecified: Secondary | ICD-10-CM | POA: Diagnosis not present

## 2016-11-16 DIAGNOSIS — M1990 Unspecified osteoarthritis, unspecified site: Secondary | ICD-10-CM | POA: Diagnosis not present

## 2016-11-17 DIAGNOSIS — I10 Essential (primary) hypertension: Secondary | ICD-10-CM | POA: Diagnosis not present

## 2016-11-17 DIAGNOSIS — M1990 Unspecified osteoarthritis, unspecified site: Secondary | ICD-10-CM | POA: Diagnosis not present

## 2016-11-17 DIAGNOSIS — E039 Hypothyroidism, unspecified: Secondary | ICD-10-CM | POA: Diagnosis not present

## 2016-11-17 DIAGNOSIS — E785 Hyperlipidemia, unspecified: Secondary | ICD-10-CM | POA: Diagnosis not present

## 2016-11-17 DIAGNOSIS — K5790 Diverticulosis of intestine, part unspecified, without perforation or abscess without bleeding: Secondary | ICD-10-CM | POA: Diagnosis not present

## 2016-11-17 DIAGNOSIS — R739 Hyperglycemia, unspecified: Secondary | ICD-10-CM | POA: Diagnosis not present

## 2016-11-17 DIAGNOSIS — H42 Glaucoma in diseases classified elsewhere: Secondary | ICD-10-CM | POA: Diagnosis not present

## 2016-11-18 DIAGNOSIS — M1990 Unspecified osteoarthritis, unspecified site: Secondary | ICD-10-CM | POA: Diagnosis not present

## 2016-11-18 DIAGNOSIS — K5790 Diverticulosis of intestine, part unspecified, without perforation or abscess without bleeding: Secondary | ICD-10-CM | POA: Diagnosis not present

## 2016-11-18 DIAGNOSIS — R739 Hyperglycemia, unspecified: Secondary | ICD-10-CM | POA: Diagnosis not present

## 2016-11-18 DIAGNOSIS — H42 Glaucoma in diseases classified elsewhere: Secondary | ICD-10-CM | POA: Diagnosis not present

## 2016-11-18 DIAGNOSIS — I10 Essential (primary) hypertension: Secondary | ICD-10-CM | POA: Diagnosis not present

## 2016-11-18 DIAGNOSIS — E785 Hyperlipidemia, unspecified: Secondary | ICD-10-CM | POA: Diagnosis not present

## 2016-11-18 DIAGNOSIS — E039 Hypothyroidism, unspecified: Secondary | ICD-10-CM | POA: Diagnosis not present

## 2016-11-19 DIAGNOSIS — N390 Urinary tract infection, site not specified: Secondary | ICD-10-CM | POA: Insufficient documentation

## 2016-11-20 DIAGNOSIS — H42 Glaucoma in diseases classified elsewhere: Secondary | ICD-10-CM | POA: Diagnosis not present

## 2016-11-20 DIAGNOSIS — E785 Hyperlipidemia, unspecified: Secondary | ICD-10-CM | POA: Diagnosis not present

## 2016-11-20 DIAGNOSIS — M1990 Unspecified osteoarthritis, unspecified site: Secondary | ICD-10-CM | POA: Diagnosis not present

## 2016-11-20 DIAGNOSIS — E039 Hypothyroidism, unspecified: Secondary | ICD-10-CM | POA: Diagnosis not present

## 2016-11-20 DIAGNOSIS — K5790 Diverticulosis of intestine, part unspecified, without perforation or abscess without bleeding: Secondary | ICD-10-CM | POA: Diagnosis not present

## 2016-11-20 DIAGNOSIS — I10 Essential (primary) hypertension: Secondary | ICD-10-CM | POA: Diagnosis not present

## 2016-11-20 DIAGNOSIS — R739 Hyperglycemia, unspecified: Secondary | ICD-10-CM | POA: Diagnosis not present

## 2016-11-21 ENCOUNTER — Encounter: Payer: Self-pay | Admitting: Adult Health

## 2016-11-21 ENCOUNTER — Non-Acute Institutional Stay (SKILLED_NURSING_FACILITY): Payer: Medicare Other | Admitting: Adult Health

## 2016-11-21 DIAGNOSIS — M150 Primary generalized (osteo)arthritis: Secondary | ICD-10-CM

## 2016-11-21 DIAGNOSIS — F0390 Unspecified dementia without behavioral disturbance: Secondary | ICD-10-CM | POA: Diagnosis not present

## 2016-11-21 DIAGNOSIS — N3000 Acute cystitis without hematuria: Secondary | ICD-10-CM | POA: Diagnosis not present

## 2016-11-21 DIAGNOSIS — E039 Hypothyroidism, unspecified: Secondary | ICD-10-CM | POA: Diagnosis not present

## 2016-11-21 DIAGNOSIS — E785 Hyperlipidemia, unspecified: Secondary | ICD-10-CM | POA: Diagnosis not present

## 2016-11-21 DIAGNOSIS — M1990 Unspecified osteoarthritis, unspecified site: Secondary | ICD-10-CM | POA: Diagnosis not present

## 2016-11-21 DIAGNOSIS — K5790 Diverticulosis of intestine, part unspecified, without perforation or abscess without bleeding: Secondary | ICD-10-CM | POA: Diagnosis not present

## 2016-11-21 DIAGNOSIS — R739 Hyperglycemia, unspecified: Secondary | ICD-10-CM | POA: Diagnosis not present

## 2016-11-21 DIAGNOSIS — I10 Essential (primary) hypertension: Secondary | ICD-10-CM | POA: Diagnosis not present

## 2016-11-21 DIAGNOSIS — M159 Polyosteoarthritis, unspecified: Secondary | ICD-10-CM

## 2016-11-21 DIAGNOSIS — H42 Glaucoma in diseases classified elsewhere: Secondary | ICD-10-CM | POA: Diagnosis not present

## 2016-11-21 NOTE — Progress Notes (Signed)
Location:   [place] Room Number: 110 B Place of Service:  SNF (31)   CODE STATUS: DNR  Allergies  Allergen Reactions  . Hctz [Hydrochlorothiazide]   . Penicillins Hives    Has patient had a PCN reaction causing immediate rash, facial/tongue/throat swelling, SOB or lightheadedness with hypotension: No Has patient had a PCN reaction causing severe rash involving mucus membranes or skin necrosis: No (pt had hives and itchy rash)  Has patient had a PCN reaction that required hospitalization: No Has patient had a PCN reaction occurring within the last 10 years: No If all of the above answers are "NO", then may proceed with Cephalosporin use.     Chief Complaint  Patient presents with  . Acute Visit    Care Plan Meeting    HPI:  We have come together for her care plan meeting. She does have her RP with her. She is unable to fully participate in this meeting as her hearing aids are missing. She has experienced several falls recently and has been treated for an uti. She continues to have periods of time when she is lethargic. We have discussed her care plan. Her nursing concerns have been addressed. We have discussed her advanced directives and have filled out her MOST form. She has lost some cognition and some weakness present. She would benefit from both speech therapy and physical therapy.    Past Medical History:  Diagnosis Date  . Dementia without behavioral disturbance 01/14/2015  . Diverticulosis   . Hard of hearing   . Hyperkalemia   . Hyperlipidemia   . Hypertension   . Memory loss   . Mitral regurgitation   . Onychomycosis of toenail   . Pedal edema     Past Surgical History:  Procedure Laterality Date  . anal stenosis surgery    . left renal artery stent  1990    Social History   Socioeconomic History  . Marital status: Divorced    Spouse name: Not on file  . Number of children: Not on file  . Years of education: Not on file  . Highest  education level: Not on file  Social Needs  . Financial resource strain: Not on file  . Food insecurity - worry: Not on file  . Food insecurity - inability: Not on file  . Transportation needs - medical: Not on file  . Transportation needs - non-medical: Not on file  Occupational History  . Not on file  Tobacco Use  . Smoking status: Former Research scientist (life sciences)  . Smokeless tobacco: Never Used  . Tobacco comment: "smoked for several years"  Substance and Sexual Activity  . Alcohol use: No    Alcohol/week: 0.0 oz  . Drug use: No  . Sexual activity: Not on file  Other Topics Concern  . Not on file  Social History Narrative  . Not on file   Family History  Problem Relation Age of Onset  . Cancer Father   . Cancer Sister       VITAL SIGNS BP (!) 152/68   Pulse 72   Temp 98 F (36.7 C)   Resp 16   Ht 4\' 10"  (1.473 m)   Wt 142 lb 1.6 oz (64.5 kg)   SpO2 97%   BMI 29.70 kg/m    Outpatient Encounter Medications as of 11/21/2016  Medication Sig  . acetaminophen (TYLENOL) 325 MG tablet Take 650 mg every 4 (four) hours as needed by mouth.  Marland Kitchen amLODipine (NORVASC) 5 MG  tablet Take 5 mg by mouth daily.  Marland Kitchen aspirin 81 MG tablet Take 81 mg by mouth daily.  . ciprofloxacin (CIPRO) 250 MG tablet Take 250 mg by mouth 2 (two) times daily.  Marland Kitchen docusate sodium (COLACE) 100 MG capsule Take 100 mg by mouth at bedtime.   Marland Kitchen LACTOBACILLUS PO Take 1 capsule by mouth 2 (two) times daily.  Marland Kitchen latanoprost (XALATAN) 0.005 % ophthalmic solution Place 1 drop into both eyes at bedtime.  . metoprolol succinate (TOPROL-XL) 25 MG 24 hr tablet Take 25 mg by mouth daily.  Marland Kitchen MYRBETRIQ 50 MG TB24 tablet Take 50 mg by mouth daily.   Marland Kitchen olmesartan (BENICAR) 40 MG tablet Take 40 mg by mouth daily.   No facility-administered encounter medications on file as of 11/21/2016.      SIGNIFICANT DIAGNOSTIC EXAMS  PREVIOUS   12-24-14: left wrist x-ray: 1. Displaced distal radius fracture as described above. 2. Probable  nondisplaced ulnar styloid fracture.   12-24-14: left forearm x-ray: Fractures of the distal left radius and ulnar styloid.  NO NEW EXAMS    LABS REVIEWED: PREVIOUS    03-23-16: wbc 8.1; hgb 12.8; hct 37.7; mcv 89.1 plt 267; glucose 98; bun 13.5; create 0.81; k+ 4.1; na++ 134; ca 9.2  06-08-16: glucose 87; bun 13.7; creat 0.76; k+ 4.0; na++137; liver normal albumin 3.8  tsh 4.37  11-01-16: wbc 7.23; hgb 11.6; hct 32.6; mcv 91.2 ;plt 221 glucose 101; bun 21.1; creat 0.82; k+ 4.4; na++ 132; ca 8.9; liver normal albumin 3.8; U/A neg 11-03-16: urine culture: 100,000 colonies:e-coli: cipro   NO NEW LABS.    Review of Systems  Unable to perform ROS: Other (due to poor hearing/no hearing aids )      Physical Exam  Constitutional: No distress.  Frail   Neck: Neck supple. No thyromegaly present.  Cardiovascular: Normal rate, regular rhythm and intact distal pulses.  Murmur heard. 1/6  Pulmonary/Chest: Breath sounds normal. No respiratory distress.  Abdominal: Soft. Bowel sounds are normal. She exhibits no distension. There is no tenderness.  Musculoskeletal: She exhibits edema.  Is able to move all extremities Has 1+ lower extremity edema   Lymphadenopathy:    She has no cervical adenopathy.  Neurological: She is alert.  Skin: Skin is warm and dry. She is not diaphoretic.  Psychiatric: She has a normal mood and affect.    ASSESSMENT/ PLAN:  TODAY:   1. Dementia without behavioral disturbance 2. Recent UTI 3. Osteoarthritis  Will continue her current plan of care Will setup PT/ST to help improve upon her gait and strength and cognition   Time spent with patient and family: 45 minutes (25 minutes with advanced directives) MOST form filled out. Discussed her overall medical status; recent changes; prognosis; and medications; has verbalized understanding.      MD is aware of resident's narcotic use and is in agreement with current plan of care. We will attempt to wean resident  as apropriate   Ok Edwards NP [facility]  Contact [phone] Monday through Friday 8am- 5pm  After hours call [phone]

## 2016-11-22 DIAGNOSIS — E785 Hyperlipidemia, unspecified: Secondary | ICD-10-CM | POA: Diagnosis not present

## 2016-11-22 DIAGNOSIS — K5790 Diverticulosis of intestine, part unspecified, without perforation or abscess without bleeding: Secondary | ICD-10-CM | POA: Diagnosis not present

## 2016-11-22 DIAGNOSIS — M1990 Unspecified osteoarthritis, unspecified site: Secondary | ICD-10-CM | POA: Diagnosis not present

## 2016-11-22 DIAGNOSIS — R739 Hyperglycemia, unspecified: Secondary | ICD-10-CM | POA: Diagnosis not present

## 2016-11-22 DIAGNOSIS — I10 Essential (primary) hypertension: Secondary | ICD-10-CM | POA: Diagnosis not present

## 2016-11-22 DIAGNOSIS — H42 Glaucoma in diseases classified elsewhere: Secondary | ICD-10-CM | POA: Diagnosis not present

## 2016-11-22 DIAGNOSIS — E039 Hypothyroidism, unspecified: Secondary | ICD-10-CM | POA: Diagnosis not present

## 2016-11-23 DIAGNOSIS — H42 Glaucoma in diseases classified elsewhere: Secondary | ICD-10-CM | POA: Diagnosis not present

## 2016-11-23 DIAGNOSIS — E039 Hypothyroidism, unspecified: Secondary | ICD-10-CM | POA: Diagnosis not present

## 2016-11-23 DIAGNOSIS — E785 Hyperlipidemia, unspecified: Secondary | ICD-10-CM | POA: Diagnosis not present

## 2016-11-23 DIAGNOSIS — K5790 Diverticulosis of intestine, part unspecified, without perforation or abscess without bleeding: Secondary | ICD-10-CM | POA: Diagnosis not present

## 2016-11-23 DIAGNOSIS — R739 Hyperglycemia, unspecified: Secondary | ICD-10-CM | POA: Diagnosis not present

## 2016-11-23 DIAGNOSIS — I10 Essential (primary) hypertension: Secondary | ICD-10-CM | POA: Diagnosis not present

## 2016-11-23 DIAGNOSIS — M1990 Unspecified osteoarthritis, unspecified site: Secondary | ICD-10-CM | POA: Diagnosis not present

## 2016-11-25 DIAGNOSIS — E785 Hyperlipidemia, unspecified: Secondary | ICD-10-CM | POA: Diagnosis not present

## 2016-11-25 DIAGNOSIS — K5790 Diverticulosis of intestine, part unspecified, without perforation or abscess without bleeding: Secondary | ICD-10-CM | POA: Diagnosis not present

## 2016-11-25 DIAGNOSIS — R739 Hyperglycemia, unspecified: Secondary | ICD-10-CM | POA: Diagnosis not present

## 2016-11-25 DIAGNOSIS — E039 Hypothyroidism, unspecified: Secondary | ICD-10-CM | POA: Diagnosis not present

## 2016-11-25 DIAGNOSIS — M1990 Unspecified osteoarthritis, unspecified site: Secondary | ICD-10-CM | POA: Diagnosis not present

## 2016-11-25 DIAGNOSIS — H42 Glaucoma in diseases classified elsewhere: Secondary | ICD-10-CM | POA: Diagnosis not present

## 2016-11-25 DIAGNOSIS — I10 Essential (primary) hypertension: Secondary | ICD-10-CM | POA: Diagnosis not present

## 2016-11-27 DIAGNOSIS — H42 Glaucoma in diseases classified elsewhere: Secondary | ICD-10-CM | POA: Diagnosis not present

## 2016-11-27 DIAGNOSIS — I10 Essential (primary) hypertension: Secondary | ICD-10-CM | POA: Diagnosis not present

## 2016-11-27 DIAGNOSIS — E785 Hyperlipidemia, unspecified: Secondary | ICD-10-CM | POA: Diagnosis not present

## 2016-11-27 DIAGNOSIS — K5790 Diverticulosis of intestine, part unspecified, without perforation or abscess without bleeding: Secondary | ICD-10-CM | POA: Diagnosis not present

## 2016-11-27 DIAGNOSIS — M1990 Unspecified osteoarthritis, unspecified site: Secondary | ICD-10-CM | POA: Diagnosis not present

## 2016-11-27 DIAGNOSIS — R739 Hyperglycemia, unspecified: Secondary | ICD-10-CM | POA: Diagnosis not present

## 2016-11-27 DIAGNOSIS — E039 Hypothyroidism, unspecified: Secondary | ICD-10-CM | POA: Diagnosis not present

## 2016-11-28 DIAGNOSIS — H42 Glaucoma in diseases classified elsewhere: Secondary | ICD-10-CM | POA: Diagnosis not present

## 2016-11-28 DIAGNOSIS — M1990 Unspecified osteoarthritis, unspecified site: Secondary | ICD-10-CM | POA: Diagnosis not present

## 2016-11-28 DIAGNOSIS — K5790 Diverticulosis of intestine, part unspecified, without perforation or abscess without bleeding: Secondary | ICD-10-CM | POA: Diagnosis not present

## 2016-11-28 DIAGNOSIS — E785 Hyperlipidemia, unspecified: Secondary | ICD-10-CM | POA: Diagnosis not present

## 2016-11-28 DIAGNOSIS — R739 Hyperglycemia, unspecified: Secondary | ICD-10-CM | POA: Diagnosis not present

## 2016-11-28 DIAGNOSIS — I10 Essential (primary) hypertension: Secondary | ICD-10-CM | POA: Diagnosis not present

## 2016-11-28 DIAGNOSIS — E039 Hypothyroidism, unspecified: Secondary | ICD-10-CM | POA: Diagnosis not present

## 2016-11-29 DIAGNOSIS — I10 Essential (primary) hypertension: Secondary | ICD-10-CM | POA: Diagnosis not present

## 2016-11-29 DIAGNOSIS — E785 Hyperlipidemia, unspecified: Secondary | ICD-10-CM | POA: Diagnosis not present

## 2016-11-29 DIAGNOSIS — K5790 Diverticulosis of intestine, part unspecified, without perforation or abscess without bleeding: Secondary | ICD-10-CM | POA: Diagnosis not present

## 2016-11-29 DIAGNOSIS — E039 Hypothyroidism, unspecified: Secondary | ICD-10-CM | POA: Diagnosis not present

## 2016-11-29 DIAGNOSIS — H42 Glaucoma in diseases classified elsewhere: Secondary | ICD-10-CM | POA: Diagnosis not present

## 2016-11-29 DIAGNOSIS — R739 Hyperglycemia, unspecified: Secondary | ICD-10-CM | POA: Diagnosis not present

## 2016-11-29 DIAGNOSIS — M1990 Unspecified osteoarthritis, unspecified site: Secondary | ICD-10-CM | POA: Diagnosis not present

## 2016-11-30 DIAGNOSIS — E039 Hypothyroidism, unspecified: Secondary | ICD-10-CM | POA: Diagnosis not present

## 2016-11-30 DIAGNOSIS — R739 Hyperglycemia, unspecified: Secondary | ICD-10-CM | POA: Diagnosis not present

## 2016-11-30 DIAGNOSIS — M1990 Unspecified osteoarthritis, unspecified site: Secondary | ICD-10-CM | POA: Diagnosis not present

## 2016-11-30 DIAGNOSIS — I10 Essential (primary) hypertension: Secondary | ICD-10-CM | POA: Diagnosis not present

## 2016-11-30 DIAGNOSIS — H42 Glaucoma in diseases classified elsewhere: Secondary | ICD-10-CM | POA: Diagnosis not present

## 2016-11-30 DIAGNOSIS — K5790 Diverticulosis of intestine, part unspecified, without perforation or abscess without bleeding: Secondary | ICD-10-CM | POA: Diagnosis not present

## 2016-11-30 DIAGNOSIS — E785 Hyperlipidemia, unspecified: Secondary | ICD-10-CM | POA: Diagnosis not present

## 2016-12-01 DIAGNOSIS — K5790 Diverticulosis of intestine, part unspecified, without perforation or abscess without bleeding: Secondary | ICD-10-CM | POA: Diagnosis not present

## 2016-12-01 DIAGNOSIS — E039 Hypothyroidism, unspecified: Secondary | ICD-10-CM | POA: Diagnosis not present

## 2016-12-01 DIAGNOSIS — M1990 Unspecified osteoarthritis, unspecified site: Secondary | ICD-10-CM | POA: Diagnosis not present

## 2016-12-01 DIAGNOSIS — I10 Essential (primary) hypertension: Secondary | ICD-10-CM | POA: Diagnosis not present

## 2016-12-01 DIAGNOSIS — R739 Hyperglycemia, unspecified: Secondary | ICD-10-CM | POA: Diagnosis not present

## 2016-12-01 DIAGNOSIS — E785 Hyperlipidemia, unspecified: Secondary | ICD-10-CM | POA: Diagnosis not present

## 2016-12-01 DIAGNOSIS — H42 Glaucoma in diseases classified elsewhere: Secondary | ICD-10-CM | POA: Diagnosis not present

## 2016-12-04 DIAGNOSIS — M6281 Muscle weakness (generalized): Secondary | ICD-10-CM | POA: Diagnosis not present

## 2016-12-04 DIAGNOSIS — R489 Unspecified symbolic dysfunctions: Secondary | ICD-10-CM | POA: Diagnosis not present

## 2016-12-05 DIAGNOSIS — R489 Unspecified symbolic dysfunctions: Secondary | ICD-10-CM | POA: Diagnosis not present

## 2016-12-05 DIAGNOSIS — M6281 Muscle weakness (generalized): Secondary | ICD-10-CM | POA: Diagnosis not present

## 2016-12-06 DIAGNOSIS — R489 Unspecified symbolic dysfunctions: Secondary | ICD-10-CM | POA: Diagnosis not present

## 2016-12-06 DIAGNOSIS — M6281 Muscle weakness (generalized): Secondary | ICD-10-CM | POA: Diagnosis not present

## 2016-12-07 DIAGNOSIS — D4981 Neoplasm of unspecified behavior of retina and choroid: Secondary | ICD-10-CM | POA: Diagnosis not present

## 2016-12-07 DIAGNOSIS — H2511 Age-related nuclear cataract, right eye: Secondary | ICD-10-CM | POA: Diagnosis not present

## 2016-12-07 DIAGNOSIS — M6281 Muscle weakness (generalized): Secondary | ICD-10-CM | POA: Diagnosis not present

## 2016-12-07 DIAGNOSIS — E119 Type 2 diabetes mellitus without complications: Secondary | ICD-10-CM | POA: Diagnosis not present

## 2016-12-07 DIAGNOSIS — H401134 Primary open-angle glaucoma, bilateral, indeterminate stage: Secondary | ICD-10-CM | POA: Diagnosis not present

## 2016-12-07 DIAGNOSIS — R489 Unspecified symbolic dysfunctions: Secondary | ICD-10-CM | POA: Diagnosis not present

## 2016-12-08 ENCOUNTER — Non-Acute Institutional Stay (SKILLED_NURSING_FACILITY): Payer: Medicare Other | Admitting: Adult Health

## 2016-12-08 ENCOUNTER — Encounter: Payer: Self-pay | Admitting: Adult Health

## 2016-12-08 DIAGNOSIS — M6281 Muscle weakness (generalized): Secondary | ICD-10-CM | POA: Diagnosis not present

## 2016-12-08 DIAGNOSIS — E034 Atrophy of thyroid (acquired): Secondary | ICD-10-CM

## 2016-12-08 DIAGNOSIS — N393 Stress incontinence (female) (male): Secondary | ICD-10-CM

## 2016-12-08 DIAGNOSIS — F0390 Unspecified dementia without behavioral disturbance: Secondary | ICD-10-CM | POA: Diagnosis not present

## 2016-12-08 DIAGNOSIS — E785 Hyperlipidemia, unspecified: Secondary | ICD-10-CM

## 2016-12-08 DIAGNOSIS — R489 Unspecified symbolic dysfunctions: Secondary | ICD-10-CM | POA: Diagnosis not present

## 2016-12-08 NOTE — Progress Notes (Signed)
Location:   [place] Room Number: 110 B Place of Service:  SNF (31)   CODE STATUS: DNR  Allergies  Allergen Reactions  . Hctz [Hydrochlorothiazide]   . Penicillins Hives    Has patient had a PCN reaction causing immediate rash, facial/tongue/throat swelling, SOB or lightheadedness with hypotension: No Has patient had a PCN reaction causing severe rash involving mucus membranes or skin necrosis: No (pt had hives and itchy rash)  Has patient had a PCN reaction that required hospitalization: No Has patient had a PCN reaction occurring within the last 10 years: No If all of the above answers are "NO", then may proceed with Cephalosporin use.     Chief Complaint  Patient presents with  . Medical Management of Chronic Issues    Dyslipidemia; hypothyroidism; UI; dementia:     HPI:  She is a 83 year old long term resident of this facility being seen for the management of her chronic illnesses: dyslipidemia; hypothyroidism due to acquired atrophy; stress incontinence; dementia without behavioral disturbance . She tells me that she is feeling good. She has recovered from her UTI. She denies back pain or joint pain; no change in appetite; no worsening fatigue. There are no nursing concerns at this time.   Past Medical History:  Diagnosis Date  . Dementia without behavioral disturbance 01/14/2015  . Diverticulosis   . Hard of hearing   . Hyperkalemia   . Hyperlipidemia   . Hypertension   . Memory loss   . Mitral regurgitation   . Onychomycosis of toenail   . Pedal edema     Past Surgical History:  Procedure Laterality Date  . anal stenosis surgery    . left renal artery stent  1990    Social History   Socioeconomic History  . Marital status: Divorced    Spouse name: Not on file  . Number of children: Not on file  . Years of education: Not on file  . Highest education level: Not on file  Social Needs  . Financial resource strain: Not on file  . Food  insecurity - worry: Not on file  . Food insecurity - inability: Not on file  . Transportation needs - medical: Not on file  . Transportation needs - non-medical: Not on file  Occupational History  . Not on file  Tobacco Use  . Smoking status: Former Research scientist (life sciences)  . Smokeless tobacco: Never Used  . Tobacco comment: "smoked for several years"  Substance and Sexual Activity  . Alcohol use: No    Alcohol/week: 0.0 oz  . Drug use: No  . Sexual activity: Not on file  Other Topics Concern  . Not on file  Social History Narrative  . Not on file   Family History  Problem Relation Age of Onset  . Cancer Father   . Cancer Sister       VITAL SIGNS BP 128/64   Pulse 78   Temp 98.1 F (36.7 C)   Resp 14   Ht 4\' 10"  (1.473 m)   Wt 142 lb 1.6 oz (64.5 kg)   SpO2 98%   BMI 29.70 kg/m   Outpatient Encounter Medications as of 12/08/2016  Medication Sig  . acetaminophen (TYLENOL) 325 MG tablet Take 650 mg every 4 (four) hours as needed by mouth.  Marland Kitchen amLODipine (NORVASC) 5 MG tablet Take 5 mg by mouth daily.  Marland Kitchen aspirin 81 MG tablet Take 81 mg by mouth daily.  Marland Kitchen latanoprost (XALATAN) 0.005 % ophthalmic solution  Place 1 drop into both eyes at bedtime.  . metoprolol succinate (TOPROL-XL) 25 MG 24 hr tablet Take 25 mg by mouth daily.  Marland Kitchen MYRBETRIQ 50 MG TB24 tablet Take 50 mg by mouth daily.   Marland Kitchen olmesartan (BENICAR) 40 MG tablet Take 40 mg by mouth daily.  Marland Kitchen senna (SENOKOT) 8.6 MG TABS tablet Take 2 tablets by mouth 2 (two) times daily.  . [DISCONTINUED] docusate sodium (COLACE) 100 MG capsule Take 100 mg by mouth at bedtime.    No facility-administered encounter medications on file as of 12/08/2016.      SIGNIFICANT DIAGNOSTIC EXAMS  PREVIOUS   12-24-14: left wrist x-ray: 1. Displaced distal radius fracture as described above. 2. Probable nondisplaced ulnar styloid fracture.   12-24-14: left forearm x-ray: Fractures of the distal left radius and ulnar styloid.  NO NEW EXAMS    LABS  REVIEWED: PREVIOUS    03-23-16: wbc 8.1; hgb 12.8; hct 37.7; mcv 89.1 plt 267; glucose 98; bun 13.5; create 0.81; k+ 4.1; na++ 134; ca 9.2  06-08-16: glucose 87; bun 13.7; creat 0.76; k+ 4.0; na++137; liver normal albumin 3.8  tsh 4.37  11-01-16: wbc 7.23; hgb 11.6; hct 32.6; mcv 91.2 ;plt 221 glucose 101; bun 21.1; creat 0.82; k+ 4.4; na++ 132; ca 8.9; liver normal albumin 3.8; U/A neg 11-03-16: urine culture: 100,000 colonies:e-coli: cipro   NO NEW LABS.    Review of Systems  Constitutional: Negative for malaise/fatigue.  Respiratory: Negative for cough and shortness of breath.   Cardiovascular: Negative for chest pain, palpitations and leg swelling.  Gastrointestinal: Negative for abdominal pain, constipation and heartburn.  Musculoskeletal: Negative for back pain, joint pain and myalgias.  Skin: Negative.   Neurological: Negative for dizziness.  Psychiatric/Behavioral: The patient is not nervous/anxious.     Physical Exam  Constitutional: She is oriented to person, place, and time. No distress.  Frail   Neck: Neck supple. No thyromegaly present.  Cardiovascular: Normal rate, regular rhythm and intact distal pulses.  Murmur heard. 1/6  Pulmonary/Chest: Effort normal and breath sounds normal. No respiratory distress.  Abdominal: Soft. Bowel sounds are normal. She exhibits no distension. There is no tenderness.  Musculoskeletal: Normal range of motion. She exhibits edema.  1+ lower extremity edema Does ambulate behind wheelchair   Lymphadenopathy:    She has no cervical adenopathy.  Neurological: She is alert and oriented to person, place, and time.  Skin: Skin is warm and dry. She is not diaphoretic.  Psychiatric: She has a normal mood and affect.      ASSESSMENT/ PLAN:  TODAY:   1. Dyslipidemia: is stable  is presently not on medications; will monitor   2. Hypothyroidism: stable  is presently not on medications; will monitor tsh is 4.64   3. Urinary incontinence: stable   will continue myrbetriq 50 mg daily;   4. Dementia: no significnat change in her status; is presently not on medications; will monitor her weight is stable at 142 pounds  PREVIOUS   5. Osteoarthritis:  Is stable is presently no complaint of pain present; will not make changes and will monitor   6. Glaucoma: stable will continue xalatan nightly to both eyes  7. Constipation: stable will continue senna 2 tabs twice daily  8. Hypertension: stable b/p 128/64 will continue benicar 40 mg daily toprol xl 25 mg daily and norvasc 5 mg daily      MD is aware of resident's narcotic use and is in agreement with current plan of care. We will  attempt to wean resident as apropriate     Ok Edwards NP [facility]  Contact [phone] Monday through Friday 8am- 5pm  After hours call [phone]

## 2016-12-11 DIAGNOSIS — M6281 Muscle weakness (generalized): Secondary | ICD-10-CM | POA: Diagnosis not present

## 2016-12-11 DIAGNOSIS — R489 Unspecified symbolic dysfunctions: Secondary | ICD-10-CM | POA: Diagnosis not present

## 2016-12-19 ENCOUNTER — Encounter: Payer: Self-pay | Admitting: Adult Health

## 2016-12-19 NOTE — Progress Notes (Signed)
Location:   [place] Room Number: 110 B Place of Service:  SNF (31)   CODE STATUS: DNR  Allergies  Allergen Reactions  . Hctz [Hydrochlorothiazide]   . Penicillins Hives    Has patient had a PCN reaction causing immediate rash, facial/tongue/throat swelling, SOB or lightheadedness with hypotension: No Has patient had a PCN reaction causing severe rash involving mucus membranes or skin necrosis: No (pt had hives and itchy rash)  Has patient had a PCN reaction that required hospitalization: No Has patient had a PCN reaction occurring within the last 10 years: No If all of the above answers are "NO", then may proceed with Cephalosporin use.     Chief Complaint  Patient presents with  . Acute Visit    Flu like symptoms    HPI:    Past Medical History:  Diagnosis Date  . Dementia without behavioral disturbance 01/14/2015  . Diverticulosis   . Hard of hearing   . Hyperkalemia   . Hyperlipidemia   . Hypertension   . Memory loss   . Mitral regurgitation   . Onychomycosis of toenail   . Pedal edema     Past Surgical History:  Procedure Laterality Date  . anal stenosis surgery    . left renal artery stent  1990    Social History   Socioeconomic History  . Marital status: Divorced    Spouse name: Not on file  . Number of children: Not on file  . Years of education: Not on file  . Highest education level: Not on file  Social Needs  . Financial resource strain: Not on file  . Food insecurity - worry: Not on file  . Food insecurity - inability: Not on file  . Transportation needs - medical: Not on file  . Transportation needs - non-medical: Not on file  Occupational History  . Not on file  Tobacco Use  . Smoking status: Former Research scientist (life sciences)  . Smokeless tobacco: Never Used  . Tobacco comment: "smoked for several years"  Substance and Sexual Activity  . Alcohol use: No    Alcohol/week: 0.0 oz  . Drug use: No  . Sexual activity: Not on file  Other  Topics Concern  . Not on file  Social History Narrative  . Not on file   Family History  Problem Relation Age of Onset  . Cancer Father   . Cancer Sister       VITAL SIGNS BP (!) 177/92   Pulse 78   Temp 97.9 F (36.6 C)   Resp 18   Ht 4\' 10"  (1.473 m)   Wt 143 lb 6.4 oz (65 kg)   SpO2 95%   BMI 29.97 kg/m   Outpatient Encounter Medications as of 12/19/2016  Medication Sig  . acetaminophen (TYLENOL) 325 MG tablet Take 650 mg every 4 (four) hours as needed by mouth.  Marland Kitchen amLODipine (NORVASC) 5 MG tablet Take 5 mg by mouth daily.  Marland Kitchen aspirin 81 MG tablet Take 81 mg by mouth daily.  Marland Kitchen latanoprost (XALATAN) 0.005 % ophthalmic solution Place 1 drop into both eyes at bedtime.  . metoprolol succinate (TOPROL-XL) 25 MG 24 hr tablet Take 25 mg by mouth daily.  Marland Kitchen MYRBETRIQ 50 MG TB24 tablet Take 50 mg by mouth daily.   Marland Kitchen olmesartan (BENICAR) 40 MG tablet Take 40 mg by mouth daily.  Marland Kitchen senna (SENOKOT) 8.6 MG TABS tablet Take 2 tablets by mouth 2 (two) times daily.   No facility-administered encounter medications  on file as of 12/19/2016.      SIGNIFICANT DIAGNOSTIC EXAMS  PREVIOUS   12-24-14: left wrist x-ray: 1. Displaced distal radius fracture as described above. 2. Probable nondisplaced ulnar styloid fracture.   12-24-14: left forearm x-ray: Fractures of the distal left radius and ulnar styloid.  NO NEW EXAMS    LABS REVIEWED: PREVIOUS    03-23-16: wbc 8.1; hgb 12.8; hct 37.7; mcv 89.1 plt 267; glucose 98; bun 13.5; create 0.81; k+ 4.1; na++ 134; ca 9.2  06-08-16: glucose 87; bun 13.7; creat 0.76; k+ 4.0; na++137; liver normal albumin 3.8  tsh 4.37  11-01-16: wbc 7.23; hgb 11.6; hct 32.6; mcv 91.2 ;plt 221 glucose 101; bun 21.1; creat 0.82; k+ 4.4; na++ 132; ca 8.9; liver normal albumin 3.8; U/A neg 11-03-16: urine culture: 100,000 colonies:e-coli: cipro   NO NEW LABS.    Review of Systems  Constitutional: Negative for malaise/fatigue.  Respiratory: Negative for cough  and shortness of breath.   Cardiovascular: Negative for chest pain, palpitations and leg swelling.  Gastrointestinal: Negative for abdominal pain, constipation and heartburn.  Musculoskeletal: Negative for back pain, joint pain and myalgias.  Skin: Negative.   Neurological: Negative for dizziness.  Psychiatric/Behavioral: The patient is not nervous/anxious.     Physical Exam  Constitutional: She is oriented to person, place, and time. No distress.  Frail   Neck: Neck supple. No thyromegaly present.  Cardiovascular: Normal rate, regular rhythm and intact distal pulses.  Murmur heard. 1/6  Pulmonary/Chest: Effort normal and breath sounds normal. No respiratory distress.  Abdominal: Soft. Bowel sounds are normal. She exhibits no distension. There is no tenderness.  Musculoskeletal: Normal range of motion. She exhibits edema.  1+ lower extremity edema Does ambulate behind wheelchair   Lymphadenopathy:    She has no cervical adenopathy.  Neurological: She is alert and oriented to person, place, and time.  Skin: Skin is warm and dry. She is not diaphoretic.  Psychiatric: She has a normal mood and affect.      ASSESSMENT/ PLAN:  TODAY:   1. Dyslipidemia: is stable  is presently not on medications; will monitor   2. Hypothyroidism: stable  is presently not on medications; will monitor tsh is 4.64   3. Urinary incontinence: stable  will continue myrbetriq 50 mg daily;   4. Dementia: no significnat change in her status; is presently not on medications; will monitor her weight is stable at 142 pounds  PREVIOUS   5. Osteoarthritis:  Is stable is presently no complaint of pain present; will not make changes and will monitor   6. Glaucoma: stable will continue xalatan nightly to both eyes  7. Constipation: stable will continue senna 2 tabs twice daily  8. Hypertension: stable b/p 128/64 will continue benicar 40 mg daily toprol xl 25 mg daily and norvasc 5 mg daily      MD is  aware of resident's narcotic use and is in agreement with current plan of care. We will attempt to wean resident as apropriate     Ok Edwards NP [facility]  Contact [phone] Monday through Friday 8am- 5pm  After hours call [phone]

## 2016-12-31 NOTE — Progress Notes (Signed)
This encounter was created in error - please disregard.

## 2017-01-04 ENCOUNTER — Encounter: Payer: Self-pay | Admitting: Internal Medicine

## 2017-01-04 ENCOUNTER — Non-Acute Institutional Stay (SKILLED_NURSING_FACILITY): Payer: Medicare Other | Admitting: Internal Medicine

## 2017-01-04 DIAGNOSIS — I10 Essential (primary) hypertension: Secondary | ICD-10-CM

## 2017-01-04 DIAGNOSIS — R600 Localized edema: Secondary | ICD-10-CM

## 2017-01-04 DIAGNOSIS — N393 Stress incontinence (female) (male): Secondary | ICD-10-CM

## 2017-01-04 DIAGNOSIS — F0390 Unspecified dementia without behavioral disturbance: Secondary | ICD-10-CM

## 2017-01-04 DIAGNOSIS — E034 Atrophy of thyroid (acquired): Secondary | ICD-10-CM

## 2017-01-04 NOTE — Progress Notes (Signed)
Patient ID: Cindy Hayes, female   DOB: 1913-04-01, 84 y.o.   MRN: [id number]   Location:  [facility] Room Number: 110 B Place of Service:  SNF (31) Provider:  DR Leaann Nevils Gwendalyn Ege, DO  Patient Care Team: Gildardo Cranker, DO as PCP - General (Internal Medicine) Center, [place] ([place])  Extended Emergency Contact Information Primary Emergency Contact: Esperanza Heir Address: [address]          [place] [address] Johnnette Litter of [place] Phone: [phone] Relation: Son Secondary Emergency Contact: Rennis Chris [place] of [place] Phone: [phone] Relation: Other  Code Status:  DNR Goals of care: Advanced Directive information Advanced Directives 01/04/2017  Does Patient Have a Medical Advance Directive? Yes  Type of Advance Directive Out of facility DNR (pink MOST or yellow form)  Does patient want to make changes to medical advance directive? No - Patient declined  Copy of [place] in Chart? -  Would patient like information on creating a medical advance directive? -  Pre-existing out of facility DNR order (yellow form or pink MOST form) Yellow form placed in chart (order not valid for inpatient use);Pink MOST form placed in chart (order not valid for inpatient use)     Chief Complaint  Patient presents with  . Medical Management of Chronic Issues    Routine Visit- [facility]    HPI:  Pt is a 84 y.o. female seen today for medical management of chronic diseases.  No nursing issues. No falls. Appetite ok. Sleeps well. She ambulates while pushing standard w/c. She is a poor historian due to dementia. Hx obtained from chart  Dyslipidemia - diet controlled. No recent LDL.  Hypothyroidism - stable without medication. TSH 4.37  Urinary incontinence - stable on myrbetriq 50 mg daily;   Dementia - stable.  She does not take any meds for cognition. Weight is stable  at 143 lbs.  Osteoarthritis - generalized; no pain at this time; she takes tylenol prn  Glaucoma - stable on xalatan nightly to both eyes; followed by eye provider  Constipation - stable on senna 2 tabs twice daily  Hypertension - stable on benicar 40 mg daily; toprol xl 25 mg daily; norvasc 5 mg daily     Past Medical History:  Diagnosis Date  . Dementia without behavioral disturbance 01/14/2015  . Diverticulosis   . Hard of hearing   . Hyperkalemia   . Hyperlipidemia   . Hypertension   . Memory loss   . Mitral regurgitation   . Onychomycosis of toenail   . Pedal edema    Past Surgical History:  Procedure Laterality Date  . anal stenosis surgery    . left renal artery stent  1990    Allergies  Allergen Reactions  . Hctz [Hydrochlorothiazide]   . Penicillins Hives    Has patient had a PCN reaction causing immediate rash, facial/tongue/throat swelling, SOB or lightheadedness with hypotension: No Has patient had a PCN reaction causing severe rash involving mucus membranes or skin necrosis: No (pt had hives and itchy rash)  Has patient had a PCN reaction that required hospitalization: No Has patient had a PCN reaction occurring within the last 10 years: No If all of the above answers are "NO", then may proceed with Cephalosporin use.     Outpatient Encounter Medications as of 01/04/2017  Medication Sig  . acetaminophen (TYLENOL) 325 MG tablet Take 650 mg every 4 (four) hours as  needed by mouth.  Marland Kitchen amLODipine (NORVASC) 5 MG tablet Take 5 mg by mouth daily.  Marland Kitchen aspirin 81 MG tablet Take 81 mg by mouth daily.  Marland Kitchen latanoprost (XALATAN) 0.005 % ophthalmic solution Place 1 drop into both eyes at bedtime.  . metoprolol succinate (TOPROL-XL) 25 MG 24 hr tablet Take 25 mg by mouth daily.  Marland Kitchen MYRBETRIQ 50 MG TB24 tablet Take 50 mg by mouth daily.   Marland Kitchen olmesartan (BENICAR) 40 MG tablet Take 40 mg by mouth daily.  Marland Kitchen senna (SENOKOT) 8.6 MG TABS tablet Take 2 tablets by mouth 2 (two) times  daily.   No facility-administered encounter medications on file as of 01/04/2017.     Review of Systems  Unable to perform ROS: Dementia    Immunization History  Administered Date(s) Administered  . Influenza-Unspecified 11/02/2016  . PPD Test 09/10/2015, 09/17/2015   Pertinent  Health Maintenance Due  Topic Date Due  . DEXA SCAN  03/15/2017 (Originally 08/09/1980)  . PNA vac Low Risk Adult (1 of 2 - PCV13) 03/15/2017 (Originally 08/09/1980)  . INFLUENZA VACCINE  Completed   Fall Risk  12/08/2016 06/29/2016  Falls in the past year? - Yes  Number falls in past yr: - 2 or more  Injury with Fall? - No  Risk for fall due to : History of fall(s);Impaired mobility;Medication side effect;Impaired balance/gait;Impaired vision -   Functional Status Survey:    Vitals:   01/04/17 1412  BP: 130/63  Pulse: 70  Resp: 14  Temp: 97.6 F (36.4 C)  TempSrc: Oral  SpO2: 97%  Weight: 143 lb 6.4 oz (65 kg)  Height: 4\' 10"  (1.473 m)   Body mass index is 29.97 kg/m. Physical Exam  Constitutional: She appears well-developed and well-nourished.  Standing beside w/c in NAD, HOH  HENT:  Mouth/Throat: Oropharynx is clear and moist. No oropharyngeal exudate.  MMM; no oral thrush  Eyes: Pupils are equal, round, and reactive to light. No scleral icterus.  Neck: Neck supple. Carotid bruit is not present. No tracheal deviation present. No thyromegaly present.  Cardiovascular: Normal rate, regular rhythm and intact distal pulses. Exam reveals no gallop and no friction rub.  Murmur (1/6 SEM) heard. +1 pitting LE edema b/l. No calf TTP  Pulmonary/Chest: Effort normal and breath sounds normal. No stridor. No respiratory distress. She has no wheezes. She has no rales.  Abdominal: Soft. Normal appearance and bowel sounds are normal. She exhibits no distension and no mass. There is no hepatomegaly. There is no tenderness. There is no rigidity, no rebound and no guarding. No hernia.  obese  Musculoskeletal:  She exhibits edema and deformity (thoracic kyphosis).  Lymphadenopathy:    She has no cervical adenopathy.  Neurological: She is alert. Gait (unsteady; pushes standard w/c while standing) abnormal.  Skin: Skin is warm and dry. No rash noted.  Psychiatric: She has a normal mood and affect. Her behavior is normal. Thought content normal.    Labs reviewed: Recent Labs    03/23/16 06/08/16 11/01/16  NA 134* 137 132*  K 4.1 4.0 4.4  BUN 14 14 21   CREATININE 0.8 0.8 0.8   Recent Labs    06/08/16 11/01/16  AST 15 17  ALT 8 10  ALKPHOS 105 110   Recent Labs    03/23/16 11/01/16  WBC 8.1 7.3  NEUTROABS 5 4  HGB 12.8 11.6*  HCT 38 33*  PLT 267 221   Lab Results  Component Value Date   TSH 4.37 06/08/2016  Lab Results  Component Value Date   HGBA1C  02/04/2009    6.1 (NOTE) The ADA recommends the following therapeutic goal for glycemic control related to Hgb A1c measurement: Goal of therapy: <6.5 Hgb A1c  Reference: American Diabetes Association: Clinical Practice Recommendations 2010, Diabetes Care, 2010, 33: (Suppl  1).   No results found for: CHOL, HDL, LDLCALC, LDLDIRECT, TRIG, CHOLHDL  Significant Diagnostic Results in last 30 days:  No results found.  Assessment/Plan   ICD-10-CM   1. Dementia without behavioral disturbance, unspecified dementia type F03.90   2. Essential hypertension I10   3. Hypothyroidism due to acquired atrophy of thyroid E03.4   4. Bilateral lower extremity edema R60.0    likely 2/2 joint swelling +/- amlodipine induced  5. Stress incontinence N39.3     Cont current meds as ordered  PT/OT/ST as indicated  [facility] NP to follow  Will follow  Labs/tests ordered: none   Cobi Aldape S. Perlie Gold  [facility] and Adult Medicine [address] [place], [place] [address] [phone] Cell (Monday-Friday 8 AM - 5 PM) [phone] After 5 PM and follow prompts

## 2017-01-11 LAB — HEPATIC FUNCTION PANEL
ALT: 9 (ref 7–35)
AST: 18 (ref 13–35)
Alkaline Phosphatase: 87 (ref 25–125)
Bilirubin, Total: 0.4

## 2017-01-11 LAB — LIPID PANEL
Cholesterol: 192 (ref 0–200)
HDL: 72 — AB (ref 35–70)
LDL Cholesterol: 107
Triglycerides: 65 (ref 40–160)

## 2017-01-11 LAB — CBC AND DIFFERENTIAL
HCT: 28 — AB (ref 36–46)
HEMOGLOBIN: 10.7 — AB (ref 12.0–16.0)
Neutrophils Absolute: 5
Platelets: 174 (ref 150–399)
WBC: 8.6

## 2017-01-11 LAB — HEMOGLOBIN A1C: HEMOGLOBIN A1C: 5.6

## 2017-01-11 LAB — BASIC METABOLIC PANEL
BUN: 20 (ref 4–21)
CREATININE: 1.1 (ref 0.5–1.1)
GLUCOSE: 97
POTASSIUM: 3.5 (ref 3.4–5.3)
SODIUM: 136 — AB (ref 137–147)

## 2017-03-03 ENCOUNTER — Encounter: Payer: Self-pay | Admitting: Internal Medicine

## 2017-03-08 ENCOUNTER — Non-Acute Institutional Stay (SKILLED_NURSING_FACILITY): Payer: Medicare Other | Admitting: Internal Medicine

## 2017-03-08 ENCOUNTER — Encounter: Payer: Self-pay | Admitting: Internal Medicine

## 2017-03-08 DIAGNOSIS — R600 Localized edema: Secondary | ICD-10-CM

## 2017-03-08 DIAGNOSIS — I10 Essential (primary) hypertension: Secondary | ICD-10-CM

## 2017-03-08 DIAGNOSIS — F0390 Unspecified dementia without behavioral disturbance: Secondary | ICD-10-CM | POA: Diagnosis not present

## 2017-03-08 DIAGNOSIS — E785 Hyperlipidemia, unspecified: Secondary | ICD-10-CM

## 2017-03-08 NOTE — Progress Notes (Signed)
Patient ID: Gypsy Decant, female   DOB: 1913-07-19, 84 y.o.   MRN: [id number]   Location:  [facility] Room Number: 110 B Place of Service:  SNF [phone]) Provider:  [place], Dobson, DO  Patient Care Team: Gildardo Cranker, DO as PCP - General (Internal Medicine) Center, [place] ([place])  Extended Emergency Contact Information Primary Emergency Contact: Esperanza Heir Address: [address]          [place] [address] Johnnette Litter of [place] Phone: [phone] Relation: Son Secondary Emergency Contact: Rennis Chris States of [place] Phone: [phone] Relation: Other  Code Status:  DNR Goals of care: Advanced Directive information Advanced Directives 03/08/2017  Does Patient Have a Medical Advance Directive? Yes  Type of Advance Directive Out of facility DNR (pink MOST or yellow form)  Does patient want to make changes to medical advance directive? No - Patient declined  Copy of [place] in Chart? -  Would patient like information on creating a medical advance directive? -  Pre-existing out of facility DNR order (yellow form or pink MOST form) Yellow form placed in chart (order not valid for inpatient use);Pink MOST form placed in chart (order not valid for inpatient use)     Chief Complaint  Patient presents with  . Medical Management of Chronic Issues    [facility]    HPI:  Pt is a 84 y.o. female long term resident seen today for medical management of chronic diseases.  She has no concerns. No nursing issues. Appetite is good and she sleeps well. No falls. She ambulates while pushing her standard w/c in front of her. She is a poor historian due to dementia. Hx obtained from chart.  Dyslipidemia - stable with diet. LDL 107  Hypothyroidism - stable. She does not take any medication. TSH 4.37  Urinary incontinence - stable with myrbetriq 50 mg daily.  Dementia - stable.   She does not take any meds for cognition. Weight is down 5 lbs to 138 lbs.   Osteoarthritis - generalized; stable. She has no pain at this time; she takes tylenol prn  Glaucoma - stable on xalatan nightly to both eyes; followed by eye provider  Constipation - stable on senna 2 tabs twice daily  Hypertension - controlled on benicar 40 mg daily; toprol xl 25 mg daily; norvasc 5 mg daily. She has chronic LE edema     Past Medical History:  Diagnosis Date  . Dementia without behavioral disturbance 01/14/2015  . Diverticulosis   . Hard of hearing   . Hyperkalemia   . Hyperlipidemia   . Hypertension   . Memory loss   . Mitral regurgitation   . Onychomycosis of toenail   . Pedal edema    Past Surgical History:  Procedure Laterality Date  . anal stenosis surgery    . left renal artery stent  1990    Allergies  Allergen Reactions  . Hctz [Hydrochlorothiazide]   . Penicillins Hives    Has patient had a PCN reaction causing immediate rash, facial/tongue/throat swelling, SOB or lightheadedness with hypotension: No Has patient had a PCN reaction causing severe rash involving mucus membranes or skin necrosis: No (pt had hives and itchy rash)  Has patient had a PCN reaction that required hospitalization: No Has patient had a PCN reaction occurring within the last 10 years: No If all of the above answers are "NO", then may proceed with Cephalosporin use.  Outpatient Encounter Medications as of 03/08/2017  Medication Sig  . acetaminophen (TYLENOL) 325 MG tablet Take 650 mg every 4 (four) hours as needed by mouth.  Marland Kitchen amLODipine (NORVASC) 5 MG tablet Take 5 mg by mouth daily.  Marland Kitchen aspirin 81 MG tablet Take 81 mg by mouth daily.  Marland Kitchen latanoprost (XALATAN) 0.005 % ophthalmic solution Place 1 drop into both eyes at bedtime.  . metoprolol succinate (TOPROL-XL) 25 MG 24 hr tablet Take 25 mg by mouth daily.  Marland Kitchen MYRBETRIQ 50 MG TB24 tablet Take 50 mg by mouth daily.   Marland Kitchen olmesartan (BENICAR) 40 MG  tablet Take 40 mg by mouth daily.  Marland Kitchen senna (SENOKOT) 8.6 MG TABS tablet Take 2 tablets by mouth 2 (two) times daily.   No facility-administered encounter medications on file as of 03/08/2017.     Review of Systems  Unable to perform ROS: Dementia    Immunization History  Administered Date(s) Administered  . Influenza-Unspecified 11/02/2016  . PPD Test 09/10/2015, 09/17/2015   Pertinent  Health Maintenance Due  Topic Date Due  . DEXA SCAN  03/15/2017 (Originally 08/09/1980)  . PNA vac Low Risk Adult (1 of 2 - PCV13) 03/15/2017 (Originally 08/09/1980)  . INFLUENZA VACCINE  Completed   Fall Risk  12/08/2016 06/29/2016  Falls in the past year? - Yes  Number falls in past yr: - 2 or more  Injury with Fall? - No  Risk for fall due to : History of fall(s);Impaired mobility;Medication side effect;Impaired balance/gait;Impaired vision -   Functional Status Survey:    Vitals:   03/08/17 1148  BP: 130/74  Pulse: 69  Resp: 16  Temp: 97.9 F (36.6 C)  SpO2: 98%  Weight: 138 lb 9.6 oz (62.9 kg)  Height: 4\' 10"  (1.473 m)   Body mass index is 28.97 kg/m. Physical Exam  Constitutional: She appears well-developed and well-nourished.  HOH and wears hearing aids, looks well in NAD sitting on edge of bed  HENT:  Mouth/Throat: Oropharynx is clear and moist. No oropharyngeal exudate.  MMM; no oral thrush  Eyes: Pupils are equal, round, and reactive to light. No scleral icterus.  Neck: Neck supple. Carotid bruit is not present. No tracheal deviation present. No thyromegaly present.  Cardiovascular: Normal rate, regular rhythm and intact distal pulses. Exam reveals no gallop and no friction rub.  Murmur (2/6 SEM) heard. +2 pitting LE edema b/l. No calf TTP. TED stockings intact  Pulmonary/Chest: Effort normal and breath sounds normal. No stridor. No respiratory distress. She has no wheezes. She has no rales.  Abdominal: Soft. Normal appearance and bowel sounds are normal. She exhibits no  distension and no mass. There is no hepatomegaly. There is no tenderness. There is no rigidity, no rebound and no guarding. No hernia.  Musculoskeletal: She exhibits edema (small and large joints).  Lymphadenopathy:    She has no cervical adenopathy.  Neurological: She is alert. Gait abnormal. Abnormal coordination: unsteady.  Skin: Skin is warm and dry. No rash noted.  Psychiatric: She has a normal mood and affect. Her behavior is normal. Thought content normal.    Labs reviewed: Recent Labs    06/08/16 11/01/16 01/11/17  NA 137 132* 136*  K 4.0 4.4 3.5  BUN 14 21 20   CREATININE 0.8 0.8 1.1   Recent Labs    06/08/16 11/01/16 01/11/17  AST 15 17 18   ALT 8 10 9   ALKPHOS 105 110 87   Recent Labs    03/23/16 11/01/16 01/11/17  WBC 8.1  7.3 8.6  NEUTROABS 5 4 5   HGB 12.8 11.6* 10.7*  HCT 38 33* 28*  PLT 267 221 174   Lab Results  Component Value Date   TSH 4.37 06/08/2016   Lab Results  Component Value Date   HGBA1C 5.6 01/11/2017   Lab Results  Component Value Date   CHOL 192 01/11/2017   HDL 72 (A) 01/11/2017   LDLCALC 107 01/11/2017   TRIG 65 01/11/2017    Significant Diagnostic Results in last 30 days:  No results found.  Assessment/Plan   ICD-10-CM   1. Dementia without behavioral disturbance, unspecified dementia type F03.90   2. Essential hypertension I10   3. Bilateral lower extremity edema R60.0   4. Hyperlipidemia, unspecified hyperlipidemia type E78.5    Cont current meds as ordered  PT/OT/ST as indicated. She walks aorund in the hallways with assistance of holding onto her w/c.  [facility] NP to follow  Will follow  Labs/tests ordered: none   Ashlea Dusing S. Perlie Gold  [facility] and Adult Medicine [address] [place], [place] [address] [phone] Cell (Monday-Friday 8 AM - 5 PM) [phone] After 5 PM and follow prompts

## 2017-04-05 ENCOUNTER — Non-Acute Institutional Stay (SKILLED_NURSING_FACILITY): Payer: Medicare Other | Admitting: Internal Medicine

## 2017-04-05 ENCOUNTER — Encounter: Payer: Self-pay | Admitting: Internal Medicine

## 2017-04-05 DIAGNOSIS — F0390 Unspecified dementia without behavioral disturbance: Secondary | ICD-10-CM | POA: Diagnosis not present

## 2017-04-05 DIAGNOSIS — E034 Atrophy of thyroid (acquired): Secondary | ICD-10-CM | POA: Diagnosis not present

## 2017-04-05 DIAGNOSIS — R600 Localized edema: Secondary | ICD-10-CM

## 2017-04-05 DIAGNOSIS — I10 Essential (primary) hypertension: Secondary | ICD-10-CM

## 2017-04-05 NOTE — Progress Notes (Signed)
Patient ID: Cindy Hayes, female   DOB: May 23, 1913, 84 y.o.   MRN: [id number]  Location:  [facility] Room Number: 110 B Place of Service:  SNF [phone]) Provider:  K[place]hoven, Norco, DO  Patient Care Team: Gildardo Cranker, DO as PCP - General (Internal Medicine) Center, [place] ([place])  Extended Emergency Contact Information Primary Emergency Contact: Esperanza Heir Address: [address]          [place] [address] Johnnette Litter of [place] Phone: [phone] Relation: Son Secondary Emergency Contact: Rennis Chris States of [place] Phone: [phone] Relation: Other  Code Status:  DNR Goals of care: Advanced Directive information Advanced Directives 04/05/2017  Does Patient Have a Medical Advance Directive? Yes  Type of Advance Directive Out of facility DNR (pink MOST or yellow form)  Does patient want to make changes to medical advance directive? No - Patient declined  Copy of [place] in Chart? -  Would patient like information on creating a medical advance directive? -  Pre-existing out of facility DNR order (yellow form or pink MOST form) Yellow form placed in chart (order not valid for inpatient use);Pink MOST form placed in chart (order not valid for inpatient use)     Chief Complaint  Patient presents with  . Medical Management of Chronic Issues    [facility]    HPI:  Pt is a 84 y.o. female seen today for medical management of chronic diseases.  She has no concerns. No falls. Appetite ok and sleeps well. She is HOH. She is a poor historian due to dementia. Hx obtained from chart.  Dyslipidemia - diet controlled. LDL 107; HDL 72.  Hypothyroidism - stable without medication. TSH 4.37  Urinary incontinence - controlled on myrbetriq 50 mg daily   Dementia - unchanged.  She does not take any meds for cognition. Weight is stable at 138 lbs.  Osteoarthritis -  generalized; controlled with prn tylenol.  Glaucoma - stable on xalatan nightly to both eyes; followed by eye provider  Constipation - controlled on senna 2 tabs twice daily  Hypertension - BP stable on benicar 40 mg daily; toprol xl 25 mg daily; norvasc 5 mg daily. No HA or dizziness. She has chronic BLE edema and wears TED stockings   Past Medical History:  Diagnosis Date  . Dementia without behavioral disturbance 01/14/2015  . Diverticulosis   . Hard of hearing   . Hyperkalemia   . Hyperlipidemia   . Hypertension   . Memory loss   . Mitral regurgitation   . Onychomycosis of toenail   . Pedal edema    Past Surgical History:  Procedure Laterality Date  . anal stenosis surgery    . left renal artery stent  1990    Allergies  Allergen Reactions  . Hctz [Hydrochlorothiazide]   . Penicillins Hives    Has patient had a PCN reaction causing immediate rash, facial/tongue/throat swelling, SOB or lightheadedness with hypotension: No Has patient had a PCN reaction causing severe rash involving mucus membranes or skin necrosis: No (pt had hives and itchy rash)  Has patient had a PCN reaction that required hospitalization: No Has patient had a PCN reaction occurring within the last 10 years: No If all of the above answers are "NO", then may proceed with Cephalosporin use.     Outpatient Encounter Medications as of 04/05/2017  Medication Sig  . acetaminophen (TYLENOL) 325 MG tablet Take 650 mg every 4 (  four) hours as needed by mouth.  Marland Kitchen amLODipine (NORVASC) 5 MG tablet Take 5 mg by mouth daily.  Marland Kitchen aspirin 81 MG tablet Take 81 mg by mouth daily.  Marland Kitchen latanoprost (XALATAN) 0.005 % ophthalmic solution Place 1 drop into both eyes at bedtime.  . metoprolol succinate (TOPROL-XL) 25 MG 24 hr tablet Take 25 mg by mouth daily.  Marland Kitchen MYRBETRIQ 50 MG TB24 tablet Take 50 mg by mouth daily.   Marland Kitchen olmesartan (BENICAR) 40 MG tablet Take 40 mg by mouth daily.  Marland Kitchen senna (SENOKOT) 8.6 MG TABS tablet Take 2  tablets by mouth 2 (two) times daily.   No facility-administered encounter medications on file as of 04/05/2017.     Review of Systems  Unable to perform ROS: Dementia    Immunization History  Administered Date(s) Administered  . Influenza-Unspecified 11/02/2016  . PPD Test 09/10/2015, 09/17/2015   Pertinent  Health Maintenance Due  Topic Date Due  . INFLUENZA VACCINE  08/02/2017  . DEXA SCAN  Discontinued  . PNA vac Low Risk Adult  Discontinued   Fall Risk  12/08/2016 06/29/2016  Falls in the past year? - Yes  Number falls in past yr: - 2 or more  Injury with Fall? - No  Risk for fall due to : History of fall(s);Impaired mobility;Medication side effect;Impaired balance/gait;Impaired vision -   Functional Status Survey:    Vitals:   04/05/17 1341  BP: (!) 142/76  Pulse: 60  Resp: 16  Temp: 98.2 F (36.8 C)  SpO2: 97%  Weight: 138 lb 9.6 oz (62.9 kg)  Height: 4\' 10"  (1.473 m)   Body mass index is 28.97 kg/m. Physical Exam  Constitutional: She appears well-developed.  Frail appearing in NAD sitting in w/c  HENT:  Mouth/Throat: Oropharynx is clear and moist. No oropharyngeal exudate.  MMM; no oral thrush  Eyes: Pupils are equal, round, and reactive to light. No scleral icterus.  Neck: Neck supple. Carotid bruit is not present. No tracheal deviation present. No thyromegaly present.  Cardiovascular: Normal rate, regular rhythm and intact distal pulses. Exam reveals no gallop and no friction rub.  Murmur (1/6 SEM) heard. +2 pitting LLE edema; +1 pitting RLE edema; no calf TTP; chronic venous stasis changes  Pulmonary/Chest: Effort normal and breath sounds normal. No stridor. No respiratory distress. She has no wheezes. She has no rales.  Abdominal: Soft. Normal appearance and bowel sounds are normal. She exhibits no distension and no mass. There is no hepatomegaly. There is no tenderness. There is no rigidity, no rebound and no guarding. No hernia.  obese    Musculoskeletal: She exhibits edema and deformity (thoracic kyphosis).  Lymphadenopathy:    She has no cervical adenopathy.  Neurological: She is alert.  Skin: Skin is warm and dry. No rash noted.  Psychiatric: She has a normal mood and affect. Her behavior is normal. Thought content normal.    Labs reviewed: Recent Labs    06/08/16 11/01/16 01/11/17  NA 137 132* 136*  K 4.0 4.4 3.5  BUN 14 21 20   CREATININE 0.8 0.8 1.1   Recent Labs    06/08/16 11/01/16 01/11/17  AST 15 17 18   ALT 8 10 9   ALKPHOS 105 110 87   Recent Labs    11/01/16 01/11/17  WBC 7.3 8.6  NEUTROABS 4 5  HGB 11.6* 10.7*  HCT 33* 28*  PLT 221 174   Lab Results  Component Value Date   TSH 4.37 06/08/2016   Lab Results  Component  Value Date   HGBA1C 5.6 01/11/2017   Lab Results  Component Value Date   CHOL 192 01/11/2017   HDL 72 (A) 01/11/2017   LDLCALC 107 01/11/2017   TRIG 65 01/11/2017    Significant Diagnostic Results in last 30 days:  No results found.  Assessment/Plan   ICD-10-CM   1. Dementia without behavioral disturbance, unspecified dementia type F03.90   2. Bilateral lower extremity edema R60.0   3. Essential hypertension I10   4. Hypothyroidism due to acquired atrophy of thyroid E03.4    Cont current meds as ordered  PT/OT/ST as indicated  Check TSH, CMP, lipid panel in May 2019  [facility] NP to follow  Will follow   Labs/tests ordered: none   Bertice Risse S. Perlie Gold  [facility] and Adult Medicine [address] [place], [place] [address] [phone] Cell (Monday-Friday 8 AM - 5 PM) [phone] After 5 PM and follow prompts

## 2017-06-04 ENCOUNTER — Encounter: Payer: Self-pay | Admitting: Internal Medicine

## 2017-06-04 ENCOUNTER — Non-Acute Institutional Stay (SKILLED_NURSING_FACILITY): Payer: Medicare Other | Admitting: Internal Medicine

## 2017-06-04 DIAGNOSIS — M150 Primary generalized (osteo)arthritis: Secondary | ICD-10-CM

## 2017-06-04 DIAGNOSIS — F0390 Unspecified dementia without behavioral disturbance: Secondary | ICD-10-CM | POA: Diagnosis not present

## 2017-06-04 DIAGNOSIS — R600 Localized edema: Secondary | ICD-10-CM | POA: Diagnosis not present

## 2017-06-04 DIAGNOSIS — N393 Stress incontinence (female) (male): Secondary | ICD-10-CM

## 2017-06-04 DIAGNOSIS — M159 Polyosteoarthritis, unspecified: Secondary | ICD-10-CM

## 2017-06-04 DIAGNOSIS — I10 Essential (primary) hypertension: Secondary | ICD-10-CM

## 2017-06-04 DIAGNOSIS — E785 Hyperlipidemia, unspecified: Secondary | ICD-10-CM

## 2017-06-04 DIAGNOSIS — E034 Atrophy of thyroid (acquired): Secondary | ICD-10-CM

## 2017-06-04 NOTE — Progress Notes (Signed)
Patient ID: Cindy Hayes, female   DOB: 1913/09/21, 84 y.o.   MRN: [id number]  Location:  [facility] Room Number: 110 B Place of Service:  SNF [phone]) Provider:  [place], [place], DO  Patient Care Team: Gildardo Cranker, DO as PCP - [facility] ([facility], [place] [facility])  Extended Emergency Contact Information Primary Emergency Contact: Esperanza Heir Address: [address]          [place] [address] Johnnette Litter of [place] Phone: [phone] Relation: Son Secondary Emergency Contact: Rennis Chris [place] of [place] Phone: [phone] Relation: Other  Code Status:  DNR Goals of care: Advanced Directive information Advanced Directives 06/04/2017  Does Patient Have a Medical Advance Directive? Yes  Type of Advance Directive Out of facility DNR (pink MOST or yellow form)  Does patient want to make changes to medical advance directive? No - Patient declined  Copy of [place] in Chart? -  Would patient like information on creating a medical advance directive? -  Pre-existing out of facility DNR order (yellow form or pink MOST form) Yellow form placed in chart (order not valid for inpatient use);Pink MOST form placed in chart (order not valid for inpatient use)     Chief Complaint  Patient presents with  . Medical Management of Chronic Issues    [facility]    HPI:  Pt is a 84 y.o. female seen today for medical management of chronic diseases.  She has no c/o today. She is a poor historian due to dementia. Hx obtained from chart. No nursing issues. No falls. Appetite is excellent. She ambulates by walking behind the w/c she pushes.  Dyslipidemia - diet controlled. LDL 107; HDL 72.  Hypothyroidism - stable without medication. TSH 4.37 in June 2018  Urinary incontinence - stable on myrbetriq 50 mg daily   Dementia - stable; likely senile type.  She does not take any  meds for cognition. Weight is stable at 138 lbs.  Osteoarthritis - generalized; controlled with prn tylenol.  Glaucoma - stable on xalatan nightly to both eyes; no recent vision changes. followed by eye provider  Constipation - stable on senna 2 tabs twice daily  Hypertension - controlled on benicar 40 mg daily; toprol xl 25 mg daily; norvasc 5 mg daily. No HA or dizziness. She has chronic BLE edema and wears TED stockings most days     Past Medical History:  Diagnosis Date  . Dementia without behavioral disturbance 01/14/2015  . Diverticulosis   . Hard of hearing   . Hyperkalemia   . Hyperlipidemia   . Hypertension   . Memory loss   . Mitral regurgitation   . Onychomycosis of toenail   . Pedal edema    Past Surgical History:  Procedure Laterality Date  . anal stenosis surgery    . left renal artery stent  1990    Allergies  Allergen Reactions  . Hctz [Hydrochlorothiazide]   . Penicillins Hives    Has patient had a PCN reaction causing immediate rash, facial/tongue/throat swelling, SOB or lightheadedness with hypotension: No Has patient had a PCN reaction causing severe rash involving mucus membranes or skin necrosis: No (pt had hives and itchy rash)  Has patient had a PCN reaction that required hospitalization: No Has patient had a PCN reaction occurring within the last 10 years: No If all of the above answers are "NO", then may proceed with Cephalosporin use.  Outpatient Encounter Medications as of 06/04/2017  Medication Sig  . acetaminophen (TYLENOL) 325 MG tablet Take 650 mg every 4 (four) hours as needed by mouth.  Marland Kitchen amLODipine (NORVASC) 5 MG tablet Take 5 mg by mouth daily.  Marland Kitchen aspirin 81 MG tablet Take 81 mg by mouth daily.  Marland Kitchen latanoprost (XALATAN) 0.005 % ophthalmic solution Place 1 drop into both eyes at bedtime.  . metoprolol succinate (TOPROL-XL) 25 MG 24 hr tablet Take 25 mg by mouth daily.  Marland Kitchen MYRBETRIQ 50 MG TB24 tablet Take 50 mg by mouth daily.   Marland Kitchen  olmesartan (BENICAR) 40 MG tablet Take 40 mg by mouth daily.  Marland Kitchen senna (SENOKOT) 8.6 MG TABS tablet Take 2 tablets by mouth 2 (two) times daily.   No facility-administered encounter medications on file as of 06/04/2017.     Review of Systems  Unable to perform ROS: Dementia    Immunization History  Administered Date(s) Administered  . Influenza-Unspecified 11/02/2016  . PPD Test 09/10/2015, 09/17/2015   Pertinent  Health Maintenance Due  Topic Date Due  . INFLUENZA VACCINE  08/02/2017  . DEXA SCAN  Discontinued  . PNA vac Low Risk Adult  Discontinued   Fall Risk  12/08/2016 06/29/2016  Falls in the past year? - Yes  Number falls in past yr: - 2 or more  Injury with Fall? - No  Risk for fall due to : History of fall(s);Impaired mobility;Medication side effect;Impaired balance/gait;Impaired vision -   Functional Status Survey:    Vitals:   06/04/17 1027  BP: 139/76  Pulse: 89  Resp: 18  Temp: 98 F (36.7 C)  SpO2: 98%  Weight: 138 lb 6.4 oz (62.8 kg)  Height: 4\' 10"  (1.473 m)   Body mass index is 28.93 kg/m. Physical Exam  Constitutional: She appears well-developed and well-nourished.  Sitting in edge of bed in NAD; HOH  HENT:  Mouth/Throat: Oropharynx is clear and moist. No oropharyngeal exudate.  MMM; no oral thrush  Eyes: Pupils are equal, round, and reactive to light. No scleral icterus.  Neck: Neck supple. Carotid bruit is not present. No tracheal deviation present.  Cardiovascular: Normal rate, regular rhythm and intact distal pulses. Exam reveals no gallop and no friction rub.  Murmur (1/6 SEM) heard. +1 pitting LE edema b/l. No calf TTP  Pulmonary/Chest: Effort normal and breath sounds normal. No stridor. No respiratory distress. She has no wheezes. She has no rales.  Abdominal: Soft. Normal appearance and bowel sounds are normal. She exhibits no distension and no mass. There is no hepatomegaly. There is no tenderness. There is no rigidity, no rebound and no  guarding. No hernia.  obese  Musculoskeletal: She exhibits edema and deformity (thoracic kyphosis).  Lymphadenopathy:    She has no cervical adenopathy.  Neurological: She is alert. She has normal reflexes.  Skin: Skin is warm and dry. No rash noted.  Psychiatric: She has a normal mood and affect. Her behavior is normal. Thought content normal.    Labs reviewed: Recent Labs    06/08/16 11/01/16 01/11/17  NA 137 132* 136*  K 4.0 4.4 3.5  BUN 14 21 20   CREATININE 0.8 0.8 1.1   Recent Labs    06/08/16 11/01/16 01/11/17  AST 15 17 18   ALT 8 10 9   ALKPHOS 105 110 87   Recent Labs    11/01/16 01/11/17  WBC 7.3 8.6  NEUTROABS 4 5  HGB 11.6* 10.7*  HCT 33* 28*  PLT 221 174   Lab  Results  Component Value Date   TSH 4.37 06/08/2016   Lab Results  Component Value Date   HGBA1C 5.6 01/11/2017   Lab Results  Component Value Date   CHOL 192 01/11/2017   HDL 72 (A) 01/11/2017   LDLCALC 107 01/11/2017   TRIG 65 01/11/2017    Significant Diagnostic Results in last 30 days:  No results found.  Assessment/Plan   ICD-10-CM   1. Hypothyroidism due to acquired atrophy of thyroid E03.4   2. Essential hypertension I10   3. Bilateral lower extremity edema R60.0   4. Dementia without behavioral disturbance, unspecified dementia type F03.90   5. Stress incontinence N39.3   6. Primary osteoarthritis involving multiple joints M15.0   7. Hyperlipidemia, unspecified hyperlipidemia type E78.5     Cont current meds as ordered  PT/OT/ST as indicated  [facility] NP to follow  Will follow   Labs/tests ordered: TSH, CMP   Anthony Roland S. Perlie Gold  [facility] [address] [place], [place] [address] [phone] Cell (Monday-Friday 8 AM - 5 PM) [phone] After 5 PM and follow prompts

## 2017-06-05 LAB — BASIC METABOLIC PANEL
BUN: 21 (ref 4–21)
Creatinine: 0.9 (ref 0.5–1.1)
Glucose: 96
POTASSIUM: 3.8 (ref 3.4–5.3)
Sodium: 137 (ref 137–147)

## 2017-06-05 LAB — TSH: TSH: 4.64 (ref 0.41–5.90)

## 2017-07-19 ENCOUNTER — Non-Acute Institutional Stay (SKILLED_NURSING_FACILITY): Payer: Medicare Other

## 2017-07-19 DIAGNOSIS — Z0000 Encounter for general adult medical examination without abnormal findings: Secondary | ICD-10-CM

## 2017-07-19 NOTE — Patient Instructions (Signed)
[name] , Thank you for taking time to come for your Medicare Wellness Visit. I appreciate your ongoing commitment to your health goals. Please review the following plan we discussed and let me know if I can assist you in the future.   Screening recommendations/referrals: Colonoscopy excluded, over age 82 Mammogram excluded, over age 11 Bone Density excluded Recommended yearly ophthalmology/optometry visit for glaucoma screening and checkup Recommended yearly dental visit for hygiene and checkup  Vaccinations: Influenza vaccine due 2019 fall season Pneumococcal vaccine excluded Tdap vaccine due, facility declined Shingles vaccine not in past records    Advanced directives: in chart  Conditions/risks identified: none  Next appointment: Dr. Eulas Post makes rounds   Preventive Care 65 Years and Older, Female Preventive care refers to lifestyle choices and visits with your health care provider that can promote health and wellness. What does preventive care include?  A yearly physical exam. This is also called an annual well check.  Dental exams once or twice a year.  Routine eye exams. Ask your health care provider how often you should have your eyes checked.  Personal lifestyle choices, including:  Daily care of your teeth and gums.  Regular physical activity.  Eating a healthy diet.  Avoiding tobacco and drug use.  Limiting alcohol use.  Practicing safe sex.  Taking low-dose aspirin every day.  Taking vitamin and mineral supplements as recommended by your health care provider. What happens during an annual well check? The services and screenings done by your health care provider during your annual well check will depend on your age, overall health, lifestyle risk factors, and family history of disease. Counseling  Your health care provider may ask you questions about your:  Alcohol use.  Tobacco use.  Drug use.  Emotional well-being.  Home and  relationship well-being.  Sexual activity.  Eating habits.  History of falls.  Memory and ability to understand (cognition).  Work and work Statistician.  Reproductive health. Screening  You may have the following tests or measurements:  Height, weight, and BMI.  Blood pressure.  Lipid and cholesterol levels. These may be checked every 5 years, or more frequently if you are over 63 years old.  Skin check.  Lung cancer screening. You may have this screening every year starting at age 29 if you have a 30-pack-year history of smoking and currently smoke or have quit within the past 15 years.  Fecal occult blood test (FOBT) of the stool. You may have this test every year starting at age 35.  Flexible sigmoidoscopy or colonoscopy. You may have a sigmoidoscopy every 5 years or a colonoscopy every 10 years starting at age 39.  Hepatitis C blood test.  Hepatitis B blood test.  Sexually transmitted disease (STD) testing.  Diabetes screening. This is done by checking your blood sugar (glucose) after you have not eaten for a while (fasting). You may have this done every 1-3 years.  Bone density scan. This is done to screen for osteoporosis. You may have this done starting at age 44.  Mammogram. This may be done every 1-2 years. Talk to your health care provider about how often you should have regular mammograms. Talk with your health care provider about your test results, treatment options, and if necessary, the need for more tests. Vaccines  Your health care provider may recommend certain vaccines, such as:  Influenza vaccine. This is recommended every year.  Tetanus, diphtheria, and acellular pertussis (Tdap, Td) vaccine. You may need a Td booster every 10  years.  Zoster vaccine. You may need this after age 60.  Pneumococcal 13-valent conjugate (PCV13) vaccine. One dose is recommended after age 57.  Pneumococcal polysaccharide (PPSV23) vaccine. One dose is recommended after  age 52. Talk to your health care provider about which screenings and vaccines you need and how often you need them. This information is not intended to replace advice given to you by your health care provider. Make sure you discuss any questions you have with your health care provider. Document Released: 01/15/2015 Document Revised: 09/08/2015 Document Reviewed: 10/20/2014 Elsevier Interactive Patient Education  2017 [place] Prevention in the Home Falls can cause injuries. They can happen to people of all ages. There are many things you can do to make your home safe and to help prevent falls. What can I do on the outside of my home?  Regularly fix the edges of walkways and driveways and fix any cracks.  Remove anything that might make you trip as you walk through a door, such as a raised step or threshold.  Trim any bushes or trees on the path to your home.  Use bright outdoor lighting.  Clear any walking paths of anything that might make someone trip, such as rocks or tools.  Regularly check to see if handrails are loose or broken. Make sure that both sides of any steps have handrails.  Any raised decks and porches should have guardrails on the edges.  Have any leaves, snow, or ice cleared regularly.  Use sand or salt on walking paths during winter.  Clean up any spills in your garage right away. This includes oil or grease spills. What can I do in the bathroom?  Use night lights.  Install grab bars by the toilet and in the tub and shower. Do not use towel bars as grab bars.  Use non-skid mats or decals in the tub or shower.  If you need to sit down in the shower, use a plastic, non-slip stool.  Keep the floor dry. Clean up any water that spills on the floor as soon as it happens.  Remove soap buildup in the tub or shower regularly.  Attach bath mats securely with double-sided non-slip rug tape.  Do not have throw rugs and other things on the floor that can  make you trip. What can I do in the bedroom?  Use night lights.  Make sure that you have a light by your bed that is easy to reach.  Do not use any sheets or blankets that are too big for your bed. They should not hang down onto the floor.  Have a firm chair that has side arms. You can use this for support while you get dressed.  Do not have throw rugs and other things on the floor that can make you trip. What can I do in the kitchen?  Clean up any spills right away.  Avoid walking on wet floors.  Keep items that you use a lot in easy-to-reach places.  If you need to reach something above you, use a strong step stool that has a grab bar.  Keep electrical cords out of the way.  Do not use floor polish or wax that makes floors slippery. If you must use wax, use non-skid floor wax.  Do not have throw rugs and other things on the floor that can make you trip. What can I do with my stairs?  Do not leave any items on the stairs.  Make sure that  there are handrails on both sides of the stairs and use them. Fix handrails that are broken or loose. Make sure that handrails are as long as the stairways.  Check any carpeting to make sure that it is firmly attached to the stairs. Fix any carpet that is loose or worn.  Avoid having throw rugs at the top or bottom of the stairs. If you do have throw rugs, attach them to the floor with carpet tape.  Make sure that you have a light switch at the top of the stairs and the bottom of the stairs. If you do not have them, ask someone to add them for you. What else can I do to help prevent falls?  Wear shoes that:  Do not have high heels.  Have rubber bottoms.  Are comfortable and fit you well.  Are closed at the toe. Do not wear sandals.  If you use a stepladder:  Make sure that it is fully opened. Do not climb a closed stepladder.  Make sure that both sides of the stepladder are locked into place.  Ask someone to hold it for you,  if possible.  Clearly mark and make sure that you can see:  Any grab bars or handrails.  First and last steps.  Where the edge of each step is.  Use tools that help you move around (mobility aids) if they are needed. These include:  Canes.  Walkers.  Scooters.  Crutches.  Turn on the lights when you go into a dark area. Replace any light bulbs as soon as they burn out.  Set up your furniture so you have a clear path. Avoid moving your furniture around.  If any of your floors are uneven, fix them.  If there are any pets around you, be aware of where they are.  Review your medicines with your doctor. Some medicines can make you feel dizzy. This can increase your chance of falling. Ask your doctor what other things that you can do to help prevent falls. This information is not intended to replace advice given to you by your health care provider. Make sure you discuss any questions you have with your health care provider. Document Released: 10/15/2008 Document Revised: 05/27/2015 Document Reviewed: 01/23/2014 Elsevier Interactive Patient Education  2017 [employer].

## 2017-07-19 NOTE — Progress Notes (Signed)
Subjective:   MARYJAYNE KLEVEN is a 84 y.o. female who presents for Medicare Annual (Subsequent) preventive examination at [facility]  Last AWV-06/29/2016       Objective:     Vitals: BP 138/75 (BP Location: Left Arm, Patient Position: Sitting)   Pulse 85   Temp 98.3 F (36.8 C) (Oral)   Ht 4\' 10"  (1.473 m)   Wt 138 lb (62.6 kg)   BMI 28.84 kg/m   Body mass index is 28.84 kg/m.  Advanced Directives 07/19/2017 06/04/2017 04/05/2017 03/08/2017 01/04/2017 12/19/2016 12/08/2016  Does Patient Have a Medical Advance Directive? Yes Yes Yes Yes Yes Yes Yes  Type of Advance Directive Out of facility DNR (pink MOST or yellow form) Out of facility DNR (pink MOST or yellow form) Out of facility DNR (pink MOST or yellow form) Out of facility DNR (pink MOST or yellow form) Out of facility DNR (pink MOST or yellow form) Out of facility DNR (pink MOST or yellow form) Out of facility DNR (pink MOST or yellow form)  Does patient want to make changes to medical advance directive? No - Patient declined No - Patient declined No - Patient declined No - Patient declined No - Patient declined No - Patient declined No - Patient declined  Copy of [place] in [place]  Would patient like information on creating a medical advance directive? - - - - - - -  Pre-existing out of facility DNR order (yellow form or pink MOST form) Yellow form placed in chart (order not valid for inpatient use);Pink MOST form placed in chart (order not valid for inpatient use) Yellow form placed in chart (order not valid for inpatient use);Pink MOST form placed in chart (order not valid for inpatient use) Yellow form placed in chart (order not valid for inpatient use);Pink MOST form placed in chart (order not valid for inpatient use) Yellow form placed in chart (order not valid for inpatient use);Pink MOST form placed in chart (order not valid for inpatient use) Yellow form placed in chart (order  not valid for inpatient use);Pink MOST form placed in chart (order not valid for inpatient use) Yellow form placed in chart (order not valid for inpatient use);Pink MOST form placed in chart (order not valid for inpatient use) Yellow form placed in chart (order not valid for inpatient use);Pink MOST form placed in chart (order not valid for inpatient use)    Tobacco Social History   Tobacco Use  Smoking Status Former Smoker  Smokeless Tobacco Never Used  Tobacco Comment   "smoked for several years"     Counseling given: Not Answered Comment: "smoked for several years"   Clinical Intake:  Pre-visit preparation completed: No  Pain : No/denies pain     Nutritional Risks: None Diabetes: No  How often do you need to have someone help you when you read instructions, pamphlets, or other written materials from your doctor or pharmacy?: 2 - Rarely  Interpreter Needed?: No  Information entered by :: Tyson Dense, RN  Past Medical History:  Diagnosis Date  . Dementia without behavioral disturbance 01/14/2015  . Diverticulosis   . Hard of hearing   . Hyperkalemia   . Hyperlipidemia   . Hypertension   . Memory loss   . Mitral regurgitation   . Onychomycosis of toenail   . Pedal edema    Past Surgical History:  Procedure Laterality Date  . anal stenosis surgery    .  left renal artery stent  1990   Family History  Problem Relation Age of Onset  . Cancer Father   . Cancer Sister    Social History   Socioeconomic History  . Marital status: Divorced    Spouse name: Not on file  . Number of children: Not on file  . Years of education: Not on file  . Highest education level: Not on file  Occupational History  . Not on file  Social Needs  . Financial resource strain: Not hard at all  . Food insecurity:    Worry: Never true    Inability: Never true  . Transportation needs:    Medical: No    Non-medical: No  Tobacco Use  . Smoking status: Former Research scientist (life sciences)  .  Smokeless tobacco: Never Used  . Tobacco comment: "smoked for several years"  Substance and Sexual Activity  . Alcohol use: No    Alcohol/week: 0.0 oz  . Drug use: No  . Sexual activity: Not on file  Lifestyle  . Physical activity:    Days per week: 0 days    Minutes per session: 0 min  . Stress: Not at all  Relationships  . Social connections:    Talks on phone: Never    Gets together: Three times a week    Attends religious service: Never    Active member of club or organization: No    Attends meetings of clubs or organizations: Never    Relationship status: Divorced  Other Topics Concern  . Not on file  Social History Narrative  . Not on file    Outpatient Encounter Medications as of 07/19/2017  Medication Sig  . acetaminophen (TYLENOL) 325 MG tablet Take 650 mg every 4 (four) hours as needed by mouth.  Marland Kitchen amLODipine (NORVASC) 5 MG tablet Take 5 mg by mouth daily.  Marland Kitchen aspirin 81 MG tablet Take 81 mg by mouth daily.  Marland Kitchen latanoprost (XALATAN) 0.005 % ophthalmic solution Place 1 drop into both eyes at bedtime.  . metoprolol succinate (TOPROL-XL) 25 MG 24 hr tablet Take 25 mg by mouth daily.  Marland Kitchen MYRBETRIQ 50 MG TB24 tablet Take 50 mg by mouth daily.   Marland Kitchen olmesartan (BENICAR) 40 MG tablet Take 40 mg by mouth daily.  Marland Kitchen senna (SENOKOT) 8.6 MG TABS tablet Take 2 tablets by mouth 2 (two) times daily.   No facility-administered encounter medications on file as of 07/19/2017.     Activities of Daily Living In your present state of health, do you have any difficulty performing the following activities: 07/19/2017  Hearing? Y  Vision? N  Difficulty concentrating or making decisions? Y  Walking or climbing stairs? Y  Dressing or bathing? Y  Doing errands, shopping? Y  Preparing Food and eating ? Y  Using the Toilet? Y  In the past six months, have you accidently leaked urine? Y  Do you have problems with loss of bowel control? Y  Managing your Medications? Y  Managing your Finances?  Y  Housekeeping or managing your Housekeeping? Y  Some recent data might be hidden    Patient Care Team: Gildardo Cranker, DO as PCP - General (Internal Medicine) Center, [place] ([place])    Assessment:   This is a routine wellness examination for Jalon.  Exercise Activities and Dietary recommendations Current Exercise Habits: The patient does not participate in regular exercise at present, Exercise limited by: orthopedic condition(s)  Goals    None      Fall  Risk Fall Risk  07/19/2017 12/08/2016 06/29/2016  Falls in the past year? No - Yes  Number falls in past yr: - - 2 or more  Injury with Fall? - - No  Risk for fall due to : - History of fall(s);Impaired mobility;Medication side effect;Impaired balance/gait;Impaired vision -   Is the patient's home free of loose throw rugs in walkways, pet beds, electrical cords, etc?   yes      Grab bars in the bathroom? yes      Handrails on the stairs?   yes      Adequate lighting?   yes  Depression Screen PHQ 2/9 Scores 07/19/2017 06/29/2016  PHQ - 2 Score 0 0     Cognitive Function     6CIT Screen 07/19/2017 06/29/2016  What Year? 4 points 4 points  What month? 3 points 0 points  What time? 0 points 0 points  Count back from 20 0 points 0 points  Months in reverse 4 points 4 points  Repeat phrase 10 points 10 points  Total Score 21 18    Immunization History  Administered Date(s) Administered  . Influenza-Unspecified 11/02/2016  . PPD Test 09/10/2015, 09/17/2015    Qualifies for Shingles Vaccine? Not in past records  Screening Tests Health Maintenance  Topic Date Due  . TETANUS/TDAP  03/09/2043 (Originally 08/10/1932)  . INFLUENZA VACCINE  08/02/2017  . DEXA SCAN  Discontinued  . PNA vac Low Risk Adult  Discontinued    Cancer Screenings: Lung: Low Dose CT Chest recommended if Age 31-80 years, 30 pack-year currently smoking OR have quit w/in 15years. Patient does not qualify. Breast:  Up  to date on Mammogram? Yes   Up to date of Bone Density/Dexa? Yes, exlcuded Colorectal: up to date  Additional Screenings:  Hepatitis C Screening: declined TDAP due: facility declined    Plan:  I have personally reviewed and addressed the Medicare Annual Wellness questionnaire and have noted the following in the patient's chart:  A. Medical and social history B. Use of alcohol, tobacco or illicit drugs  C. Current medications and supplements D. Functional ability and status E.  Nutritional status F.  Physical activity G. Advance directives H. List of other physicians I.  Hospitalizations, surgeries, and ER visits in previous 12 months J.  [place] to include hearing, vision, cognitive, depression L. Referrals and appointments - none  In addition, I have reviewed and discussed with patient certain preventive protocols, quality metrics, and best practice recommendations. A written personalized care plan for preventive services as well as general preventive health recommendations were provided to patient.  See attached scanned questionnaire for additional information.   Signed,   Tyson Dense, RN Nurse Health Advisor  Patient Concerns: None

## 2017-08-06 ENCOUNTER — Non-Acute Institutional Stay (SKILLED_NURSING_FACILITY): Payer: Medicare Other | Admitting: Internal Medicine

## 2017-08-06 ENCOUNTER — Encounter: Payer: Self-pay | Admitting: Internal Medicine

## 2017-08-06 DIAGNOSIS — R600 Localized edema: Secondary | ICD-10-CM

## 2017-08-06 DIAGNOSIS — E034 Atrophy of thyroid (acquired): Secondary | ICD-10-CM | POA: Diagnosis not present

## 2017-08-06 DIAGNOSIS — M159 Polyosteoarthritis, unspecified: Secondary | ICD-10-CM

## 2017-08-06 DIAGNOSIS — M150 Primary generalized (osteo)arthritis: Secondary | ICD-10-CM | POA: Diagnosis not present

## 2017-08-06 DIAGNOSIS — I10 Essential (primary) hypertension: Secondary | ICD-10-CM | POA: Diagnosis not present

## 2017-08-06 DIAGNOSIS — F0390 Unspecified dementia without behavioral disturbance: Secondary | ICD-10-CM

## 2017-08-06 NOTE — Progress Notes (Signed)
Patient ID: Cindy Hayes, female   DOB: 02-01-1913, 84 y.o.   MRN: [id number]   Location:  [facility] Room Number: 110 B Place of Service:  SNF [phone]) Provider:  Bandana, [place], DO  Patient Care Team: Gildardo Cranker, DO as PCP - General (Internal Medicine) [facility], [place] ([place])  Extended Emergency Contact Information Primary Emergency Contact: Esperanza Heir Address: [address]          [place] [address] [place]nette Litter of [place] Phone: [phone] Relation: Son Secondary Emergency Contact: Rennis Chris [place] of [place] Phone: [phone] Relation: Other  Code Status:  DNR Goals of care: Advanced Directive information Advanced Directives 08/06/2017  Does Patient Have a Medical Advance Directive? Yes  Type of Advance Directive Out of facility DNR (pink MOST or yellow form)  Does patient want to make changes to medical advance directive? No - Patient declined  Copy of [place] in Chart? -  Would patient like information on creating a medical advance directive? -  Pre-existing out of facility DNR order (yellow form or pink MOST form) Yellow form placed in chart (order not valid for inpatient use);Pink MOST form placed in chart (order not valid for inpatient use)     Chief Complaint  Patient presents with  . Medical Management of Chronic Issues    [facility]    HPI:  Pt is a 84 y.o. female seen today for medical management of chronic diseases.  She has no concerns. She is HOH. No nursing issues. No falls. Appetite ok and sleeps well. She is a poor historian due to dementia, hx obtained from chart. She continues to ambulate by walking behind her w/c.  Dyslipidemia - diet controlled. LDL 107; HDL 72.  Hypothyroidism - stable without medication. TSH 4.37 in June 2018  Urinary incontinence - stable on myrbetriq 50 mg daily   Dementia - stable; likely senile type.   She does not take any meds for cognition. Weight is stable at 138 lbs.  Osteoarthritis - generalized; controlled with prn tylenol.  Glaucoma - stable on xalatan nightly to both eyes; no recent vision changes. followed by eye provider  Constipation - stable on senna 2 tabs twice daily  Hypertension - controlled on benicar 40 mg daily; toprol xl 25 mg daily; norvasc 5 mg daily. No HA or dizziness. She has chronic BLE edema and wears TED stockings most days   Past Medical History:  Diagnosis Date  . Dementia without behavioral disturbance 01/14/2015  . Diverticulosis   . Hard of hearing   . Hyperkalemia   . Hyperlipidemia   . Hypertension   . Memory loss   . Mitral regurgitation   . Onychomycosis of toenail   . Pedal edema    Past Surgical History:  Procedure Laterality Date  . anal stenosis surgery    . left renal artery stent  1990    Allergies  Allergen Reactions  . Hctz [Hydrochlorothiazide]   . Penicillins Hives    Has patient had a PCN reaction causing immediate rash, facial/tongue/throat swelling, SOB or lightheadedness with hypotension: No Has patient had a PCN reaction causing severe rash involving mucus membranes or skin necrosis: No (pt had hives and itchy rash)  Has patient had a PCN reaction that required hospitalization: No Has patient had a PCN reaction occurring within the last 10 years: No If all of the above answers are "NO", then may proceed with Cephalosporin use.  Outpatient Encounter Medications as of 08/06/2017  Medication Sig  . acetaminophen (TYLENOL) 325 MG tablet Take 650 mg every 4 (four) hours as needed by mouth.  Marland Kitchen amLODipine (NORVASC) 5 MG tablet Take 5 mg by mouth daily.  Marland Kitchen aspirin 81 MG tablet Take 81 mg by mouth daily.  . Cholecalciferol 1000 units tablet Take 1,000 Units by mouth daily.  Marland Kitchen latanoprost (XALATAN) 0.005 % ophthalmic solution Place 1 drop into both eyes at bedtime.  . metoprolol succinate (TOPROL-XL) 25 MG 24 hr tablet Take  25 mg by mouth daily.  Marland Kitchen MYRBETRIQ 50 MG TB24 tablet Take 50 mg by mouth daily.   Marland Kitchen olmesartan (BENICAR) 40 MG tablet Take 40 mg by mouth daily.  Marland Kitchen senna (SENOKOT) 8.6 MG TABS tablet Take 2 tablets by mouth 2 (two) times daily.   No facility-administered encounter medications on file as of 08/06/2017.     Review of Systems  Unable to perform ROS: Dementia    Immunization History  Administered Date(s) Administered  . Influenza-Unspecified 11/02/2016  . PPD Test 09/10/2015, 09/17/2015   Pertinent  Health Maintenance Due  Topic Date Due  . INFLUENZA VACCINE  11/06/2017 (Originally 08/02/2017)  . DEXA SCAN  Discontinued  . PNA vac Low Risk Adult  Discontinued   Fall Risk  07/19/2017 12/08/2016 06/29/2016  Falls in the past year? No - Yes  Number falls in past yr: - - 2 or more  Injury with Fall? - - No  Risk for fall due to : - History of fall(s);Impaired mobility;Medication side effect;Impaired balance/gait;Impaired vision -   Functional Status Survey:    Vitals:   08/06/17 0947  BP: 128/72  Pulse: 74  Resp: 16  Temp: 97.6 F (36.4 C)  SpO2: 98%  Weight: 139 lb 3.2 oz (63.1 kg)  Height: 4\' 10"  (1.473 m)   Body mass index is 29.09 kg/m. Physical Exam  Constitutional: She appears well-developed and well-nourished.  Sitting on bed in NAD  HENT:  Mouth/Throat: Oropharynx is clear and moist. No oropharyngeal exudate.  MMM; no oral thrush  Eyes: Pupils are equal, round, and reactive to light. No scleral icterus.  Neck: Neck supple. Carotid bruit is not present. No tracheal deviation present. No thyromegaly present.  Cardiovascular: Normal rate, regular rhythm and intact distal pulses. Exam reveals no gallop and no friction rub.  Murmur (1/6 SEM) heard. +1 pitting LE edema b/l with chronic venous stasis changes; no calf TTP  Pulmonary/Chest: Effort normal and breath sounds normal. No stridor. No respiratory distress. She has no wheezes. She has no rales.  Abdominal: Soft.  Normal appearance and bowel sounds are normal. She exhibits no distension and no mass. There is no hepatomegaly. There is no tenderness. There is no rigidity, no rebound and no guarding. No hernia.  obese  Musculoskeletal: She exhibits edema (small and large joints).  Lymphadenopathy:    She has no cervical adenopathy.  Neurological: She is alert. She has normal reflexes. Gait abnormal.  Skin: Skin is warm and dry. No rash noted.  Psychiatric: She has a normal mood and affect. Her behavior is normal. Judgment and thought content normal.    Labs reviewed: Recent Labs    11/01/16 01/11/17 06/05/17  NA 132* 136* 137  K 4.4 3.5 3.8  BUN 21 20 21   CREATININE 0.8 1.1 0.9   Recent Labs    11/01/16 01/11/17  AST 17 18  ALT 10 9  ALKPHOS 110 87   Recent Labs    11/01/16  01/11/17  WBC 7.3 8.6  NEUTROABS 4 5  HGB 11.6* 10.7*  HCT 33* 28*  PLT 221 174   Lab Results  Component Value Date   TSH 4.64 06/05/2017   Lab Results  Component Value Date   HGBA1C 5.6 01/11/2017   Lab Results  Component Value Date   CHOL 192 01/11/2017   HDL 72 (A) 01/11/2017   LDLCALC 107 01/11/2017   TRIG 65 01/11/2017    Significant Diagnostic Results in last 30 days:  No results found.  Assessment/Plan   ICD-10-CM   1. Primary osteoarthritis involving multiple joints M15.0   2. Bilateral lower extremity edema R60.0   3. Essential hypertension I10   4. Hypothyroidism due to acquired atrophy of thyroid E03.4   5. Dementia without behavioral disturbance, unspecified dementia type F03.90      Cont current meds as ordered  PT/OT/ST as indicated  Cont nutritional supplements as indicated  [facility] NP to follow  Will follow  Labs/tests ordered: none   Ameenah Prosser S. Perlie Gold  [facility] [address] [place], [place] [address] [phone] Cell (Monday-Friday 8 AM - 5 PM) [phone] After 5 PM and follow prompts

## 2017-08-22 ENCOUNTER — Encounter: Payer: Self-pay | Admitting: Internal Medicine

## 2017-10-18 ENCOUNTER — Encounter: Payer: Self-pay | Admitting: Internal Medicine

## 2017-10-18 ENCOUNTER — Non-Acute Institutional Stay (SKILLED_NURSING_FACILITY): Payer: Medicare Other | Admitting: Internal Medicine

## 2017-10-18 DIAGNOSIS — E034 Atrophy of thyroid (acquired): Secondary | ICD-10-CM

## 2017-10-18 DIAGNOSIS — R600 Localized edema: Secondary | ICD-10-CM

## 2017-10-18 DIAGNOSIS — I10 Essential (primary) hypertension: Secondary | ICD-10-CM | POA: Diagnosis not present

## 2017-10-18 DIAGNOSIS — F0390 Unspecified dementia without behavioral disturbance: Secondary | ICD-10-CM

## 2017-10-18 DIAGNOSIS — M159 Polyosteoarthritis, unspecified: Secondary | ICD-10-CM

## 2017-10-18 DIAGNOSIS — M150 Primary generalized (osteo)arthritis: Secondary | ICD-10-CM | POA: Diagnosis not present

## 2017-10-18 NOTE — Progress Notes (Signed)
Patient ID: Cindy Hayes, female   DOB: 1913/01/28, 84 y.o.   MRN: [id number]   Location:  [facility] Room Number: 110 B Place of Service:  SNF [phone]) Provider:  [place], [place], DO  Patient Care Team: Gildardo Cranker, DO as PCP - [facility], [place] ([place])  Extended Emergency Contact Information Primary Emergency Contact: Esperanza Heir Address: [address]          [place] [address] [place]itter of [place] Phone: [phone] Relation: Son Secondary Emergency Contact: Rennis Chris [place] of [place] Phone: [phone] Relation: Other  Code Status:  DNR Goals of care: Advanced Directive information Advanced Directives 10/18/2017  Does Patient Have a Medical Advance Directive? Yes  Type of Advance Directive Out of facility DNR (pink MOST or yellow form)  Does patient want to make changes to medical advance directive? No - Patient declined  Copy of [place] in Chart? -  Would patient like information on creating a medical advance directive? -  Pre-existing out of facility DNR order (yellow form or pink MOST form) Yellow form placed in chart (order not valid for inpatient use);Pink MOST form placed in chart (order not valid for inpatient use)     Chief Complaint  Patient presents with  . Medical Management of Chronic Issues    [facility]    HPI:  Pt is a 84 y.o. female seen today for medical management of chronic diseases.  She has no concerns. Appetite ok and sleeps well. No falls. She is a poor historian due to dementia. Hx obtained from chart.  Dyslipidemia - diet controlled. LDL 107; HDL 72.  Hypothyroidism - stable without medication. TSH 4.37 in June 2018  Urinary incontinence - stable on myrbetriq 50 mg daily   Dementia - stable; likely senile type.  She does not take any meds for cognition. Weight is stable at 138  lbs.  Osteoarthritis - generalized; controlled with prn tylenol.  Glaucoma - stable on xalatan nightly to both eyes; no recent vision changes. followed by eye provider  Constipation - stable on senna 2 tabs twice daily  Hypertension - controlled on benicar 40 mg daily; toprol xl 25 mg daily; norvasc 5 mg daily. No HA or dizziness. She has chronic BLE edema and wears TED stockings most days   Past Medical History:  Diagnosis Date  . Dementia without behavioral disturbance ([place]) 01/14/2015  . Diverticulosis   . Hard of hearing   . Hyperkalemia   . Hyperlipidemia   . Hypertension   . Memory loss   . Mitral regurgitation   . Onychomycosis of toenail   . Pedal edema    Past Surgical History:  Procedure Laterality Date  . anal stenosis surgery    . left renal artery stent  1990    Allergies  Allergen Reactions  . Hctz [Hydrochlorothiazide]   . Penicillins Hives    Has patient had a PCN reaction causing immediate rash, facial/tongue/throat swelling, SOB or lightheadedness with hypotension: No Has patient had a PCN reaction causing severe rash involving mucus membranes or skin necrosis: No (pt had hives and itchy rash)  Has patient had a PCN reaction that required hospitalization: No Has patient had a PCN reaction occurring within the last 10 years: No If all of the above answers are "NO", then may proceed with Cephalosporin use.     Outpatient Encounter Medications as of 10/18/2017  Medication Sig  .  acetaminophen (TYLENOL) 325 MG tablet Take 650 mg every 4 (four) hours as needed by mouth.  Marland Kitchen amLODipine (NORVASC) 5 MG tablet Take 5 mg by mouth daily.  Marland Kitchen aspirin 81 MG tablet Take 81 mg by mouth daily.  . Cholecalciferol 1000 units tablet Take 1,000 Units by mouth daily.  . Hypromellose 0.4 % SOLN Place 2 drops into both eyes 2 (two) times daily.  Marland Kitchen latanoprost (XALATAN) 0.005 % ophthalmic solution Place 1 drop into both eyes at bedtime.  . metoprolol succinate (TOPROL-XL) 25  MG 24 hr tablet Take 25 mg by mouth daily.  Marland Kitchen MYRBETRIQ 50 MG TB24 tablet Take 50 mg by mouth daily.   . NON FORMULARY Diet Type: Regular Diet, Regular texture  . olmesartan (BENICAR) 40 MG tablet Take 40 mg by mouth daily.  Marland Kitchen senna (SENOKOT) 8.6 MG TABS tablet Take 2 tablets by mouth 2 (two) times daily.   No facility-administered encounter medications on file as of 10/18/2017.     Review of Systems  Unable to perform ROS: Dementia    Immunization History  Administered Date(s) Administered  . Influenza-Unspecified 11/02/2016  . PPD Test 09/10/2015, 09/17/2015, 10/08/2017   Pertinent  Health Maintenance Due  Topic Date Due  . INFLUENZA VACCINE  11/06/2017 (Originally 08/02/2017)  . DEXA SCAN  Discontinued  . PNA vac Low Risk Adult  Discontinued   Fall Risk  07/19/2017 12/08/2016 06/29/2016  Falls in the past year? No - Yes  Number falls in past yr: - - 2 or more  Injury with Fall? - - No  Risk for fall due to : - History of fall(s);Impaired mobility;Medication side effect;Impaired balance/gait;Impaired vision -   Functional Status Survey:    Vitals:   10/18/17 0910  BP: (!) 150/70  Pulse: 67  Resp: 20  Temp: 97.9 F (36.6 C)  SpO2: 97%  Weight: 139 lb 12.8 oz (63.4 kg)  Height: 4\' 10"  (1.473 m)   Body mass index is 29.22 kg/m. Physical Exam  Constitutional: She appears well-developed and well-nourished.  HOH, frail appearing in NAD  HENT:  Mouth/Throat: Oropharynx is clear and moist. No oropharyngeal exudate.  MMM; no oral thrush  Eyes: Pupils are equal, round, and reactive to light. No scleral icterus.  Neck: Neck supple. Carotid bruit is not present. No tracheal deviation present. No thyromegaly present.  Cardiovascular: Normal rate, regular rhythm and intact distal pulses. Exam reveals no gallop and no friction rub.  Murmur (1/6 SEM) heard. +1 pitting LE edema b/l. No calf TTO. TED stockings intact  Pulmonary/Chest: Effort normal and breath sounds normal. No  stridor. No respiratory distress. She has no wheezes. She has no rales.  Abdominal: Soft. Normal appearance and bowel sounds are normal. She exhibits no distension and no mass. There is no hepatomegaly. There is no tenderness. There is no rigidity, no rebound and no guarding. No hernia.  obese  Musculoskeletal: She exhibits edema (small and large joints) and deformity (thoracic kyphosis).  Lymphadenopathy:    She has no cervical adenopathy.  Neurological: She is alert. She has normal reflexes.  Skin: Skin is warm and dry. No rash noted.  Psychiatric: She has a normal mood and affect. Her behavior is normal. Thought content normal.    Labs reviewed: Recent Labs    11/01/16 01/11/17 06/05/17  NA 132* 136* 137  K 4.4 3.5 3.8  BUN 21 20 21   CREATININE 0.8 1.1 0.9   Recent Labs    11/01/16 01/11/17  AST 17 18  ALT 10 9  ALKPHOS 110 87   Recent Labs    11/01/16 01/11/17  WBC 7.3 8.6  NEUTROABS 4 5  HGB 11.6* 10.7*  HCT 33* 28*  PLT 221 174   Lab Results  Component Value Date   TSH 4.64 06/05/2017   Lab Results  Component Value Date   HGBA1C 5.6 01/11/2017   Lab Results  Component Value Date   CHOL 192 01/11/2017   HDL 72 (A) 01/11/2017   LDLCALC 107 01/11/2017   TRIG 65 01/11/2017    Significant Diagnostic Results in last 30 days:  No results found.  Assessment/Plan   ICD-10-CM   1. Bilateral lower extremity edema R60.0   2. Primary osteoarthritis involving multiple joints M15.0   3. Essential hypertension I10   4. Hypothyroidism due to acquired atrophy of thyroid E03.4   5. Dementia without behavioral disturbance, unspecified dementia type ([place]) F03.90     Cont current meds as ordered  PT/OT/ST as indicated  [facility] NP to follow  Will follow  Labs/tests ordered: none   Charliee Krenz S. Perlie Gold  [facility] [address] [place], [place] [address] [phone] Cell (Monday-Friday 8 AM - 5  PM) [phone] After 5 PM and follow prompts

## 2020-03-02 DEATH — deceased
